# Patient Record
Sex: Male | Born: 1955 | Race: White | Hispanic: No | Marital: Married
Health system: Northeastern US, Academic
[De-identification: ages and names within clinical notes are randomized; demographics above are authoritative.]

---

## 2020-02-20 ENCOUNTER — Inpatient Hospital Stay: Admit: 2020-02-20 | Discharge: 2020-02-20 | Payer: PRIVATE HEALTH INSURANCE

## 2020-02-20 ENCOUNTER — Encounter: Admit: 2020-02-20 | Payer: PRIVATE HEALTH INSURANCE

## 2020-02-20 DIAGNOSIS — R37 Sexual dysfunction, unspecified: Secondary | ICD-10-CM

## 2020-02-20 DIAGNOSIS — K219 Gastro-esophageal reflux disease without esophagitis: Secondary | ICD-10-CM

## 2020-02-20 DIAGNOSIS — K7581 Nonalcoholic steatohepatitis (NASH): Secondary | ICD-10-CM

## 2020-02-20 DIAGNOSIS — J45909 Unspecified asthma, uncomplicated: Secondary | ICD-10-CM

## 2020-02-20 DIAGNOSIS — Z0000 Encounter for general adult medical examination without abnormal findings: Secondary | ICD-10-CM

## 2020-02-20 DIAGNOSIS — R058 Cough with exposure to severe acute respiratory syndrome coronavirus 2 (SARS-CoV-2): Secondary | ICD-10-CM

## 2020-02-20 DIAGNOSIS — R7303 Prediabetes: Secondary | ICD-10-CM

## 2020-02-20 LAB — CBC WITH AUTO DIFFERENTIAL
BKR BLOOD, UA: 40.7 % — ABNORMAL HIGH (ref 17.0–50.0)
BKR WAM ABSOLUTE IMMATURE GRANULOCYTES.: 0.03 x 1000/??L (ref 0.00–0.30)
BKR WAM ABSOLUTE LYMPHOCYTE COUNT.: 3.45 x 1000/??L (ref 0.60–3.70)
BKR WAM ABSOLUTE NRBC (2 DEC): 0 x 1000/??L (ref 0.00–1.00)
BKR WAM ANALYZER ANC: 4.23 x 1000/??L (ref 2.00–7.60)
BKR WAM BASOPHIL ABSOLUTE COUNT.: 0.07 x 1000/??L (ref 0.00–1.00)
BKR WAM BASOPHILS: 0.8 % (ref 0.0–1.4)
BKR WAM EOSINOPHIL ABSOLUTE COUNT.: 0.11 x 1000/??L (ref 0.00–1.00)
BKR WAM HEMATOCRIT (2 DEC): 47.3 % (ref 38.50–50.00)
BKR WAM IMMATURE GRANULOCYTES: 0.4 % (ref 0.0–1.0)
BKR WAM LYMPHOCYTES: 40.7 % (ref 17.0–50.0)
BKR WAM MCH (PG): 31.2 pg (ref 27.0–33.0)
BKR WAM MCHC: 34 g/dL (ref 31.0–36.0)
BKR WAM MCV: 91.7 fL (ref 80.0–100.0)
BKR WAM MONOCYTE ABSOLUTE COUNT.: 0.59 x 1000/??L (ref 0.00–1.00)
BKR WAM MONOCYTES: 7 % (ref 4.0–12.0)
BKR WAM MPV: 12.5 fL — ABNORMAL HIGH (ref 8.0–12.0)
BKR WAM NEUTROPHILS: 49.8 % (ref 39.0–72.0)
BKR WAM NUCLEATED RED BLOOD CELLS: 0 % (ref 0.0–1.0)
BKR WAM PLATELETS: 221 x1000/??L — ABNORMAL HIGH (ref 150–420)
BKR WAM RDW-CV: 11.9 % — ABNORMAL HIGH (ref 11.0–15.0)
BKR WAM RED BLOOD CELL COUNT.: 5.16 M/??L (ref 4.00–6.00)
BKR WAM WHITE BLOOD CELL COUNT: 8.5 x1000/??L (ref 4.0–11.0)

## 2020-02-20 LAB — BASIC METABOLIC PANEL
BKR ANION GAP: 11 mg/L (ref 7–17)
BKR BLOOD UREA NITROGEN: 19 mg/dL (ref 8–23)
BKR BUN / CREAT RATIO: 25.3 — ABNORMAL HIGH (ref 8.0–23.0)
BKR CALCIUM: 9.9 mg/dL (ref 8.8–10.2)
BKR CHLORIDE: 104 mmol/L (ref 98–107)
BKR CO2: 27 mmol/L (ref 20–30)
BKR CREATININE: 0.75 mg/dL (ref 0.40–1.30)
BKR EGFR (AFR AMER): >60 mL/min/{1.73_m2} (ref >60)
BKR EGFR (NON AFRICAN AMERICAN): >60 mL/min/{1.73_m2} (ref >60)
BKR GLUCOSE: 122 mg/dL — ABNORMAL HIGH (ref 70–100)
BKR POTASSIUM: 4.8 mmol/L (ref 3.3–5.3)
BKR SODIUM: 142 mmol/L (ref 136–144)
BKR WAM HEMOGLOBIN: 142 mmol/L (ref 136–144)

## 2020-02-20 LAB — TSH
BKR ALKALINE PHOSPHATASE: 2.06 ??IU/mL — ABNORMAL HIGH (ref 9–122)
BKR THYROID STIMULATING HORMONE: 2.06 u[IU]/mL

## 2020-02-20 LAB — LIPID PANEL
BKR CHOLESTEROL/HDL RATIO: 3.5 mg/dL (ref 0.0–5.0)
BKR CHOLESTEROL: 189 mg/dL
BKR HDL CHOLESTEROL: 54 mg/dL (ref >=40)
BKR LDL CHOLESTEROL CALCULATED: 114 mg/dL — ABNORMAL HIGH
BKR TRIGLYCERIDES: 103 mg/dL

## 2020-02-20 LAB — HEPATIC FUNCTION PANEL
BKR A/G RATIO: 1.8 (ref 1.0–2.2)
BKR ALANINE AMINOTRANSFERASE (ALT): 63 U/L — ABNORMAL HIGH (ref 9–59)
BKR ALBUMIN: 4.9 g/dL (ref 3.6–4.9)
BKR ASPARTATE AMINOTRANSFERASE (AST): 50 U/L — ABNORMAL HIGH (ref 10–35)
BKR AST/ALT RATIO: 0.8
BKR BILIRUBIN DIRECT: 0.2 mg/dL (ref <=0.3)
BKR BILIRUBIN TOTAL: 1.7 mg/dL — ABNORMAL HIGH (ref <=1.2)
BKR GLOBULIN: 2.8 g/dL

## 2020-02-20 LAB — URINALYSIS-MACROSCOPIC W/REFLEX MICROSCOPIC
BKR BILIRUBIN, UA: NEGATIVE
BKR GLUCOSE, UA: NEGATIVE fL — ABNORMAL HIGH (ref 8.0–12.0)
BKR KETONES, UA: NEGATIVE % (ref 39.0–72.0)
BKR LEUKOCYTE ESTERASE, UA: NEGATIVE
BKR NITRITE, UA: NEGATIVE % (ref 0.0–1.4)
BKR PH, UA: 7 (ref 5.5–7.5)
BKR SPECIFIC GRAVITY, UA: 1.029 (ref 1.005–1.030)
BKR UROBILINOGEN, UA: <2 EU/dL (ref <=2.0)
BKR WAM EOSINOPHILS: NEGATIVE % (ref 0.0–5.0)

## 2020-02-20 LAB — ALBUMIN/CREATININE PANEL, URINE, RANDOM
BKR ALBUMIN, URINE, RANDOM: 62.5 mg/L
BKR ALBUMIN/CREATININE RATIO, URINE, RANDOM: 35.7 mg/g{creat} — ABNORMAL HIGH (ref <30.0)
BKR CREATININE, URINE, RANDOM: 175 mg/dL

## 2020-02-20 LAB — HEMOGLOBIN A1C
BKR ESTIMATED AVERAGE GLUCOSE: 126 mg/dL (ref 1.0–2.2)
BKR HEMOGLOBIN A1C: 6 % — ABNORMAL HIGH (ref 4.0–5.6)

## 2020-02-20 LAB — URIC ACID: BKR URIC ACID: 4.7 mg/dL (ref 2.7–7.3)

## 2020-02-20 LAB — PSA, TOTAL (SCREENING) (BH GH L LMW YH)
BKR PROSTATE SPECIFIC ANTIGEN, SCREENING: 0.683 ng/mL (ref <=4.000)
BKR PROTEIN TOTAL: 0.683 ng/mL (ref <=4.000)

## 2020-03-04 ENCOUNTER — Inpatient Hospital Stay: Admit: 2020-03-04 | Discharge: 2020-03-04 | Payer: PRIVATE HEALTH INSURANCE

## 2020-03-04 DIAGNOSIS — Z20828 Contact with and (suspected) exposure to other viral communicable diseases: Secondary | ICD-10-CM

## 2020-03-04 DIAGNOSIS — Z20822 Contact with and (suspected) exposure to covid-19: Secondary | ICD-10-CM

## 2020-03-04 LAB — COVID-19 CLEARANCE OR FOR PLACEMENT ONLY: BKR SARS-COV-2 RNA (COVID-19) (YH): NEGATIVE

## 2020-03-05 ENCOUNTER — Inpatient Hospital Stay: Admit: 2020-03-05 | Discharge: 2020-03-05 | Payer: PRIVATE HEALTH INSURANCE

## 2020-03-05 DIAGNOSIS — Z20822 Contact with and (suspected) exposure to covid-19: Secondary | ICD-10-CM

## 2020-03-05 DIAGNOSIS — R058 Other specified cough: Secondary | ICD-10-CM

## 2020-03-07 LAB — SARS COV-2 (COVID-19) RNA- REFERENCE LAB (BH GH LMW Q YH): BKR SARS-COV-2 RNA (COVID-19) (YH): NOT DETECTED % (ref 4.0–12.0)

## 2022-02-17 ENCOUNTER — Encounter: Admit: 2022-02-17 | Payer: PRIVATE HEALTH INSURANCE

## 2022-02-17 ENCOUNTER — Inpatient Hospital Stay: Admit: 2022-02-17 | Discharge: 2022-02-17 | Payer: PRIVATE HEALTH INSURANCE

## 2022-02-17 DIAGNOSIS — Z0000 Encounter for general adult medical examination without abnormal findings: Secondary | ICD-10-CM

## 2022-02-17 DIAGNOSIS — R7303 Prediabetes: Secondary | ICD-10-CM

## 2022-02-17 DIAGNOSIS — Z125 Encounter for screening for malignant neoplasm of prostate: Secondary | ICD-10-CM

## 2022-02-17 LAB — CBC WITH AUTO DIFFERENTIAL
BKR BUN / CREAT RATIO: 44.2 % — ABNORMAL HIGH (ref 17.0–50.0)
BKR NITRITE, UA: 16.2 g/dL (ref 13.2–17.1)
BKR WAM ABSOLUTE IMMATURE GRANULOCYTES.: 0.02 x 1000/??L (ref 0.00–0.30)
BKR WAM ABSOLUTE LYMPHOCYTE COUNT.: 3.69 x 1000/??L (ref 0.60–3.70)
BKR WAM ABSOLUTE NRBC (2 DEC): 0 x 1000/??L (ref 0.00–1.00)
BKR WAM ANALYZER ANC: 3.82 x 1000/??L (ref 2.00–7.60)
BKR WAM BASOPHIL ABSOLUTE COUNT.: 0.06 x 1000/??L (ref 0.00–1.00)
BKR WAM BASOPHILS: 0.7 % (ref 0.0–1.4)
BKR WAM EOSINOPHIL ABSOLUTE COUNT.: 0.17 x 1000/??L (ref 0.00–1.00)
BKR WAM EOSINOPHILS: 2 % (ref 0.0–5.0)
BKR WAM HEMATOCRIT (2 DEC): 49 % (ref 38.50–50.00)
BKR WAM HEMOGLOBIN: 16.2 g/dL (ref 13.2–17.1)
BKR WAM IMMATURE GRANULOCYTES: 0.2 % (ref 0.0–1.0)
BKR WAM LYMPHOCYTES: 44.2 % (ref 17.0–50.0)
BKR WAM MCH (PG): 30.2 pg (ref 27.0–33.0)
BKR WAM MCHC: 33.1 g/dL (ref 31.0–36.0)
BKR WAM MCV: 91.4 fL (ref 80.0–100.0)
BKR WAM MONOCYTE ABSOLUTE COUNT.: 0.59 x 1000/??L (ref 0.00–1.00)
BKR WAM MONOCYTES: 7.1 % (ref 4.0–12.0)
BKR WAM MPV: 12.2 fL — ABNORMAL HIGH (ref 8.0–12.0)
BKR WAM NEUTROPHILS: 45.8 % (ref 39.0–72.0)
BKR WAM NUCLEATED RED BLOOD CELLS: 0 % (ref 0.0–1.0)
BKR WAM PLATELETS: 212 x1000/??L (ref 150–420)
BKR WAM RDW-CV: 13.2 % — ABNORMAL HIGH (ref 11.0–15.0)
BKR WAM RED BLOOD CELL COUNT.: 5.36 M/??L (ref 4.00–6.00)
BKR WAM WHITE BLOOD CELL COUNT: 8.4 x1000/??L (ref 4.0–11.0)

## 2022-02-17 LAB — HEPATIC FUNCTION PANEL
BKR A/G RATIO: 1.4 (ref 1.0–2.2)
BKR ALANINE AMINOTRANSFERASE (ALT): 13 U/L (ref 9–59)
BKR ALBUMIN: 4.4 g/dL (ref 3.6–4.9)
BKR ALKALINE PHOSPHATASE: 67 U/L — ABNORMAL HIGH (ref 9–122)
BKR ASPARTATE AMINOTRANSFERASE (AST): 21 U/L (ref 10–35)
BKR AST/ALT RATIO: 1.6
BKR BILIRUBIN DIRECT: 0.2 mg/dL (ref 0.0–<=0.3)
BKR BILIRUBIN TOTAL: 1.4 mg/dL — ABNORMAL HIGH (ref <=1.2)
BKR CLARITY, UA: 7.5 g/dL (ref 6.6–8.7)
BKR GLOBULIN: 3.1 g/dL (ref 2.3–3.5)
BKR PROTEIN TOTAL: 7.5 g/dL (ref 6.6–8.7)

## 2022-02-17 LAB — ALBUMIN/CREATININE PANEL, URINE, RANDOM
BKR ALBUMIN, URINE, RANDOM: 36.7 mg/L
BKR ALBUMIN/CREATININE RATIO, URINE, RANDOM: 22.1 mg/g{creat} (ref <30.0)
BKR CREATININE, URINE, RANDOM: 166 mg/dL

## 2022-02-17 LAB — PSA, TOTAL (SCREENING) (BH GH L LMW YH): BKR PROSTATE SPECIFIC ANTIGEN, SCREENING: 0.635 ng/mL (ref 2.3–<=4.000)

## 2022-02-17 LAB — URINALYSIS-MACROSCOPIC W/REFLEX MICROSCOPIC
BKR BILIRUBIN, UA: NEGATIVE mg/g Cr (ref 4.0–11.0)
BKR BLOOD, UA: NEGATIVE
BKR GLUCOSE, UA: NEGATIVE
BKR KETONES, UA: NEGATIVE mg/L
BKR LEUKOCYTE ESTERASE, UA: NEGATIVE
BKR PH, UA: 5.5 (ref 5.5–7.5)
BKR SPECIFIC GRAVITY, UA: 1.027 (ref 1.005–1.030)
BKR UROBILINOGEN, UA (MG/DL): <2 mg/dL (ref 38.50–<=2.0)

## 2022-02-17 LAB — BASIC METABOLIC PANEL
BKR ANION GAP: 11 g/dL (ref 7–17)
BKR BLOOD UREA NITROGEN: 17 mg/dL (ref 8–23)
BKR CALCIUM: 9.2 mg/dL (ref 8.8–10.2)
BKR CHLORIDE: 106 mmol/L (ref 98–107)
BKR CO2: 25 mmol/L (ref 20–30)
BKR CREATININE: 0.64 mg/dL — ABNORMAL HIGH (ref 0.40–1.30)
BKR EGFR, CREATININE (CKD-EPI 2021): >60 mL/min/1.73m2 (ref >=60–12.0)
BKR GLUCOSE: 116 mg/dL — ABNORMAL HIGH (ref 70–100)
BKR POTASSIUM: 4.1 mmol/L (ref 3.3–5.3)
BKR SODIUM: 142 mmol/L (ref 136–144)

## 2022-02-17 LAB — LIPID PANEL
BKR CHOLESTEROL/HDL RATIO: 3.2 (ref 0.0–5.0)
BKR CHOLESTEROL: 190 mg/dL
BKR HDL CHOLESTEROL: 59 mg/dL (ref >=40)
BKR LDL CHOLESTEROL SAMPSON CALCULATED: 118 mg/dL — ABNORMAL HIGH
BKR TRIGLYCERIDES: 70 mg/dL

## 2022-02-17 LAB — HEMOGLOBIN A1C
BKR ESTIMATED AVERAGE GLUCOSE: 123 mg/dL
BKR HEMOGLOBIN A1C: 5.9 % — ABNORMAL HIGH (ref 4.0–5.6)

## 2022-02-17 LAB — TSH: BKR THYROID STIMULATING HORMONE: 2.45 u[IU]/mL

## 2022-02-17 LAB — URIC ACID: BKR URIC ACID: 4.3 mg/dL (ref 2.7–7.3)

## 2022-03-25 IMAGING — MR [PERSON_NAME]. SEQUENCIAS^[PERSON_NAME]
5 series · 16 of 16 positions shown · non-contrast
Comparison: none

[Series 7: t1_tse_sag_(person_name)_1 · sagittal · 5.0mm · 0.73mm/px · 2 of 12 slices shown]
[im 1/12]
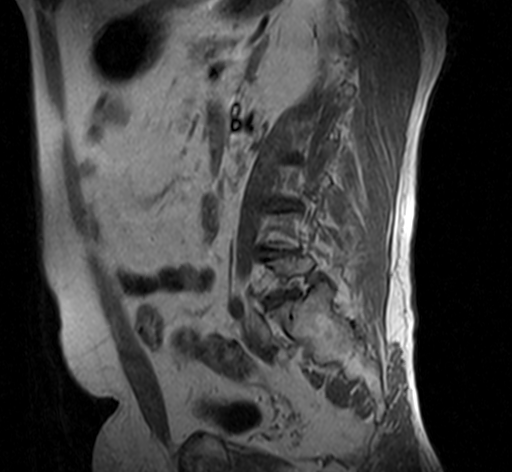
[im 12/12]
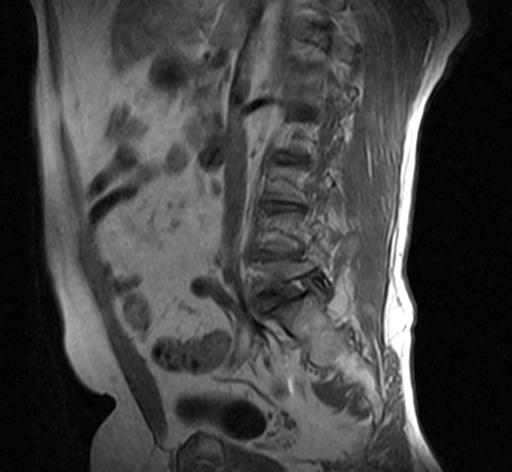

[Series 8: t2_tse_rst_sag_(person_name)_1 · sagittal · 5.0mm · 0.78mm/px · 2 of 12 slices shown]
[im 1/12]
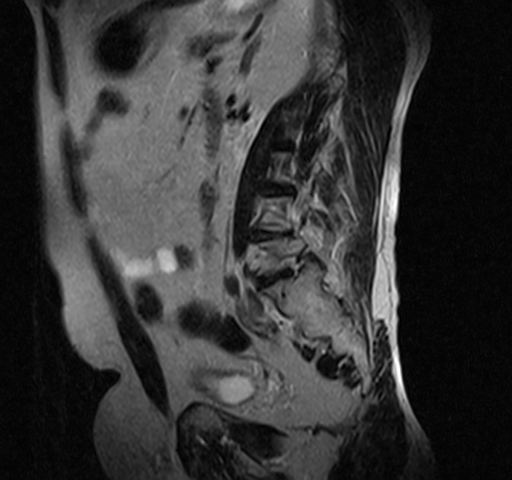
[im 12/12]
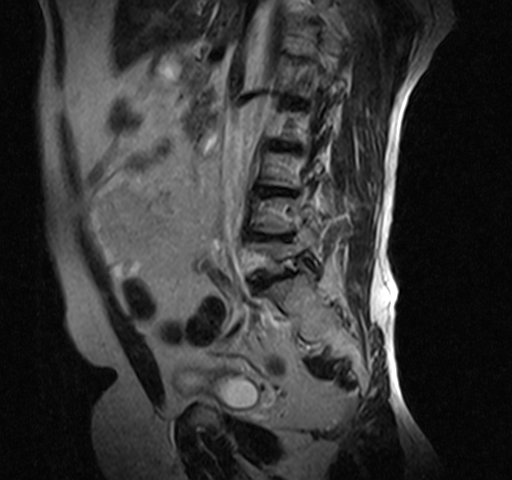

[Series 9: t2_tirm_fs_sag_(person_name)_1 · sagittal · 5.0mm · 1.25mm/px · 2 of 12 slices shown]
[im 1/12]
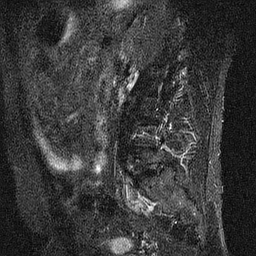
[im 12/12]
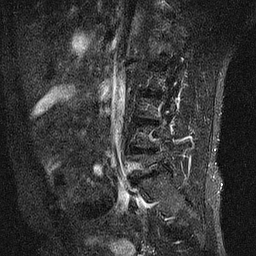

[Series 10: t2_(id)_tra_(person_name)_1 · axial · 5.0mm · 0.58mm/px · z∈[-53,+161]mm · 7 of 44 slices shown]
[im 1/44]
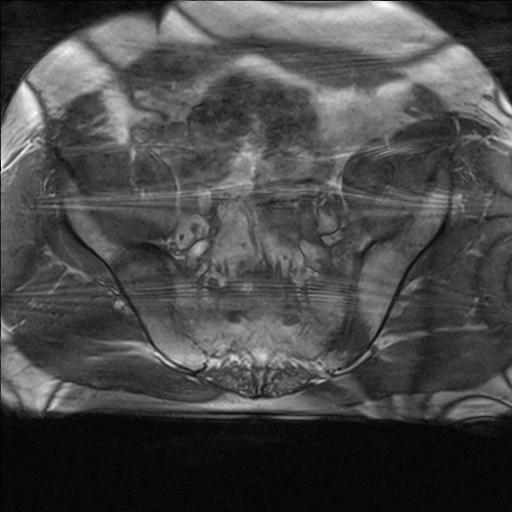
[im 8/44]
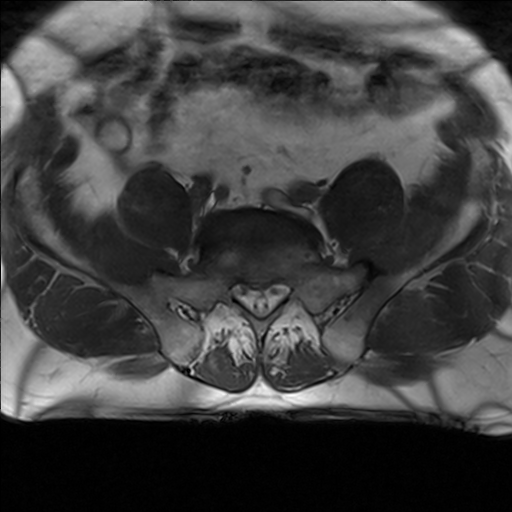
[im 15/44]
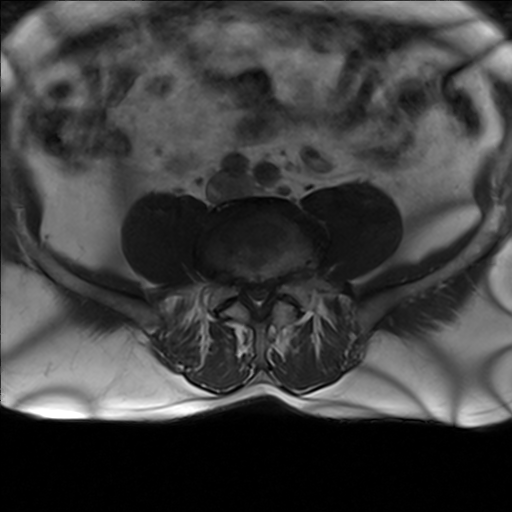
[im 22/44]
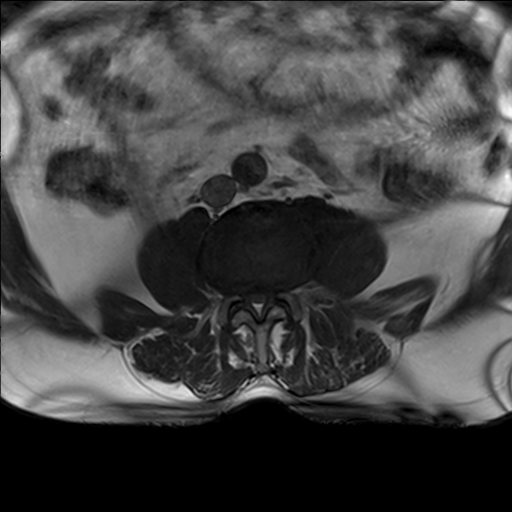
[im 29/44]
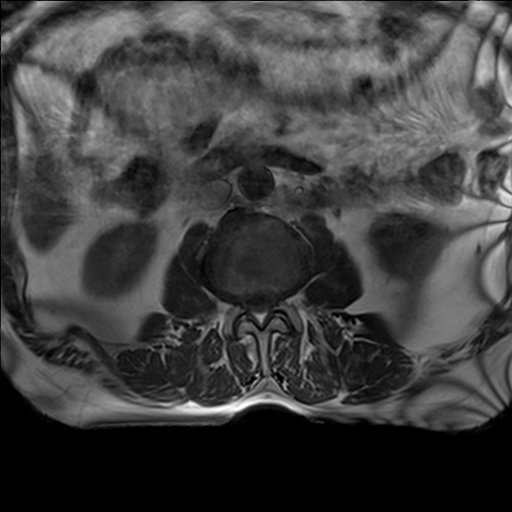
[im 36/44]
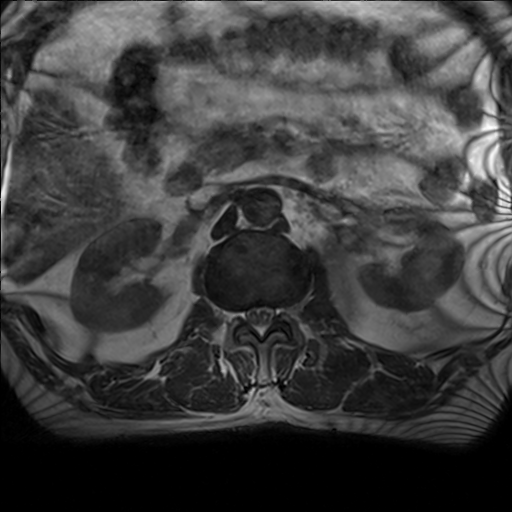
[im 44/44]
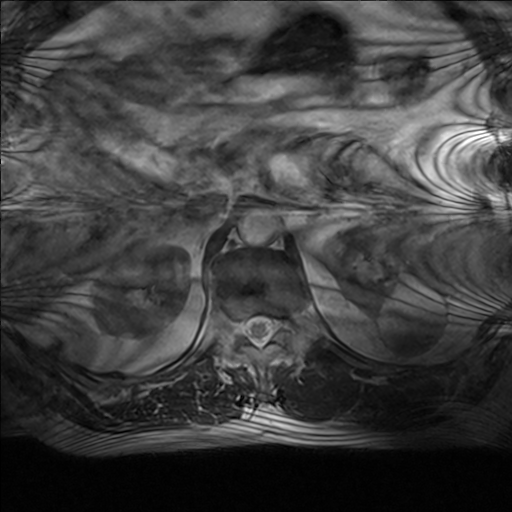

[Series 11: t1_tse_tra ( fast · axial · 5.0mm · 0.78mm/px · z∈[-51,+83]mm · 3 of 16 slices shown]
[im 1/16]
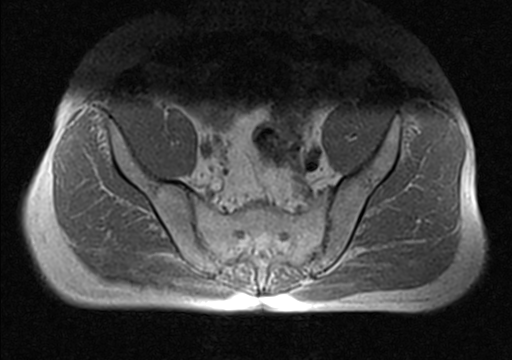
[im 8/16]
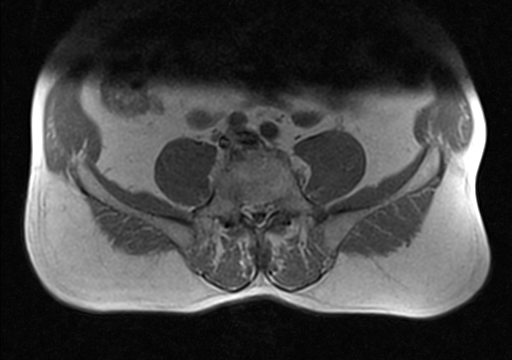
[im 16/16]
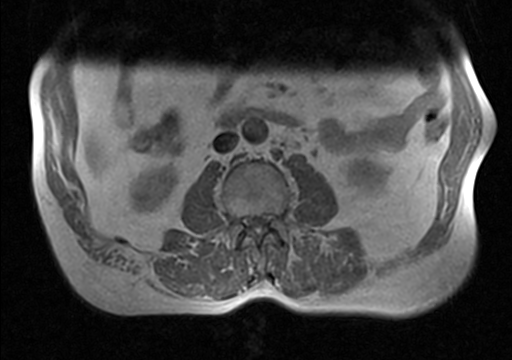

[16 of 16 positions shown; findings below may reference images not displayed]

Gênero:Masculino

Método:
Realizado estudo de ressonância magnética de coluna lombar com técnica FSE, com sequências
ponderadas em T1 e em T2, sem a infusão endovenosa do meio de contraste paramagnético.
Análise:
Corpos vertebrais alinhados e com altura preservada.
RESSONÂNCIA MAGNÉTICA DE COLUNA LOMBAR
Pedículos vertebrais constitucionalmente curto. 
Osteófitos marginais lombares. 
Hérnias de Schmorl através de alguns platôs vertebrais. 
Alterações degenerativas gordurosas de L2-L3 a L5-S1. 
alterações degenerativas das articulações interapofisárias, notadamente em L5-S1. 
Ligamentos amarelos discretamente espessados. 
Abaulamento discal em L2-L3, tocando a face ventral do saco dural, sem sinais de compressão radicular. 
Abaulamentos discais em L3-L4 e L4-L5, tocando a face ventral do saco dural, notando-se contato com as
raízes emergentes de L3 e de L4. 
Abaulamento discal em L5-S1, obliterando os recessos foraminais e comprimindo as raízes emergentes de
Gênero:Masculino
Estenose do canal vertebral em L2-L3 a L4-L5.
Cone medular com morfologia e intensidade de sinal preservados.
Raízes da cauda equina com distribuição habitual.
Demais forames intervertebrais com amplitude preservada.
Planos musculoadiposos paravertebrais de aspecto habitual.
Impressão Diagnóstica:
Abaulamento discal em L2-L3, sem sinais de compressão radicular. 
Abaulamentos discais em L3-L4 e L4-L5, notando-se contato com as raízes emergentes de L3 e de L4. 
Abaulamento discal em L5-S1, obliterando os recessos foraminais e comprimindo as raízes emergentes de
Estenose do canal vertebral em L2-L3 a L4-L5.

## 2022-04-17 ENCOUNTER — Encounter: Admit: 2022-04-17 | Payer: PRIVATE HEALTH INSURANCE | Attending: Gastroenterology

## 2022-04-17 ENCOUNTER — Ambulatory Visit: Admit: 2022-04-17 | Payer: PRIVATE HEALTH INSURANCE | Attending: Anesthesiology

## 2022-04-17 ENCOUNTER — Inpatient Hospital Stay: Admit: 2022-04-17 | Discharge: 2022-04-17 | Payer: PRIVATE HEALTH INSURANCE | Attending: Gastroenterology

## 2022-04-17 DIAGNOSIS — J45909 Unspecified asthma, uncomplicated: Secondary | ICD-10-CM

## 2022-04-17 DIAGNOSIS — K2100 Gastro-esophageal reflux disease with esophagitis, without bleeding: Secondary | ICD-10-CM

## 2022-04-17 DIAGNOSIS — Z1211 Encounter for screening for malignant neoplasm of colon: Secondary | ICD-10-CM

## 2022-04-17 DIAGNOSIS — E119 Type 2 diabetes mellitus without complications: Secondary | ICD-10-CM

## 2022-04-17 DIAGNOSIS — R1013 Epigastric pain: Secondary | ICD-10-CM

## 2022-04-17 DIAGNOSIS — E785 Hyperlipidemia, unspecified: Secondary | ICD-10-CM

## 2022-04-17 DIAGNOSIS — K640 First degree hemorrhoids: Secondary | ICD-10-CM

## 2022-04-17 DIAGNOSIS — K449 Diaphragmatic hernia without obstruction or gangrene: Secondary | ICD-10-CM

## 2022-04-17 DIAGNOSIS — R7989 Other specified abnormal findings of blood chemistry: Secondary | ICD-10-CM

## 2022-04-17 DIAGNOSIS — K222 Esophageal obstruction: Secondary | ICD-10-CM

## 2022-04-17 DIAGNOSIS — K2270 Barrett's esophagus without dysplasia: Secondary | ICD-10-CM

## 2022-04-17 DIAGNOSIS — Z7984 Long term (current) use of oral hypoglycemic drugs: Secondary | ICD-10-CM

## 2022-04-17 DIAGNOSIS — Z79899 Other long term (current) drug therapy: Secondary | ICD-10-CM

## 2022-04-17 DIAGNOSIS — K635 Polyp of colon: Secondary | ICD-10-CM

## 2022-04-17 DIAGNOSIS — Z87891 Personal history of nicotine dependence: Secondary | ICD-10-CM

## 2022-04-17 MED ORDER — FENTANYL (PF) 50 MCG/ML INJECTION SOLUTION
50 mcg/mL | Status: CP
Start: 2022-04-17 — End: ?

## 2022-04-17 MED ORDER — SODIUM CHLORIDE 0.9 % (FLUSH) INJECTION SYRINGE
0.9 % | INTRAVENOUS | Status: DC | PRN
Start: 2022-04-17 — End: 2022-04-17

## 2022-04-17 MED ORDER — LACTATED RINGERS INTRAVENOUS SOLUTION
INTRAVENOUS | Status: DC
Start: 2022-04-17 — End: 2022-04-17
  Administered 2022-04-17: 16:00:00 1000.000 mL/h via INTRAVENOUS

## 2022-04-17 MED ORDER — GLYCOPYRROLATE 0.2 MG/ML INJECTION SOLUTION
0.2 mg/mL | Status: CP
Start: 2022-04-17 — End: ?

## 2022-04-17 MED ORDER — LACTATED RINGERS INTRAVENOUS SOLUTION
INTRAVENOUS | Status: DC | PRN
Start: 2022-04-17 — End: 2022-04-17
  Administered 2022-04-17: 16:00:00 via INTRAVENOUS

## 2022-04-17 MED ORDER — SODIUM CHLORIDE 0.9 % (FLUSH) INJECTION SYRINGE
0.9 % | Freq: Three times a day (TID) | INTRAVENOUS | Status: DC
Start: 2022-04-17 — End: 2022-04-17

## 2022-04-17 MED ORDER — EMPAGLIFLOZIN 10 MG TABLET
10 mg | Freq: Every day | ORAL | Status: AC
Start: 2022-04-17 — End: ?

## 2022-04-17 MED ORDER — PROPOFOL 10 MG/ML INTRAVENOUS EMULSION
10 mg/mL | INTRAVENOUS | Status: DC | PRN
Start: 2022-04-17 — End: 2022-04-17
  Administered 2022-04-17 (×7): 10 mg/mL via INTRAVENOUS

## 2022-04-17 MED ORDER — LIDOCAINE (PF) 10 MG/ML (1 %) INJECTION SOLUTION
101 mg/mL (1 %) | INTRAVENOUS | Status: DC | PRN
Start: 2022-04-17 — End: 2022-04-17
  Administered 2022-04-17: 16:00:00 10 mg/mL (1 %) via INTRAVENOUS

## 2022-04-17 MED ORDER — FENTANYL (PF) 50 MCG/ML INJECTION SOLUTION
50 mcg/mL | INTRAVENOUS | Status: DC | PRN
Start: 2022-04-17 — End: 2022-04-17
  Administered 2022-04-17: 16:00:00 50 mcg/mL via INTRAVENOUS

## 2022-04-17 MED ORDER — GLYCOPYRROLATE 0.2 MG/ML INJECTION SOLUTION
0.2 mg/mL | INTRAVENOUS | Status: DC | PRN
Start: 2022-04-17 — End: 2022-04-17
  Administered 2022-04-17: 16:00:00 0.2 mg/mL via INTRAVENOUS

## 2022-04-17 NOTE — Anesthesia Post-Procedure Evaluation
Anesthesia Post-op NotePatient: Tracy Miller SantosProcedure(s):  Procedure(s) (LRB):UPPER GI ENDOSCOPY; W/BX, SINGLE/MULTIPLE with gastric  & esophagus bxs (N/A)COLONOSCOPY, FLEXIBLE; W/REMOVAL, LESION, SNARE with cecum polyp cs Last Vitals:  I have reviewed the post-operative vital signs as noted in the Epic chart.POSTOP EVALUATION:      Patient Recovery Location:  PACU     Vital Signs Status:  Stable     Patient Participation:  Patient participated     Mental Status:  Awake     Respiratory Status:  Acceptable     Airway Patency:  Patent     Cardiovascular/Hydration Status:  Stable     Pain Management:  Satisfactory to patient     Nausea/Vomiting Status:  Satisfactory to patientNo notable events documented.

## 2022-04-17 NOTE — Other
Post Anesthesia Transfer of Care NotePatient: Tracy Miller SantosProcedure(s) Performed: Procedure(s) (LRB):UPPER GI ENDOSCOPY; W/BX, SINGLE/MULTIPLE with gastric  & esophagus bxs (N/A)COLONOSCOPY, FLEXIBLE; W/REMOVAL, LESION, SNARE with cecum polyp csLast Vitals: I have reviewed the post-operative vital signs during the handoff as noted in the Epic chart.POSTOP HANDOFF :      Patient Location:  PACU     Level of Consciousness:  Sedated     VS stable since last recorded intra-op set? Yes       Oxygen source: room air112/74Temp 98F62bpm [id number] sat 97%Patient co-morbidities, intra-operative course, intake & output and antibiotics as per Anesthesia record were discussed with the RN.

## 2022-04-17 NOTE — Other
Patient tolerated procedure well.  Dr Ho spoke with patient and reviewed results. Discharge instructions reviewed and patient verbalized understanding. Printed instructions given to patient. All questions answered.

## 2022-04-17 NOTE — Other
Pre-operative History & PhysicalHistory: Tracy Hancock is a 67 y.o. male patient seen in office for screening colonoscopy and abdominal painRefer to consult note / office note.Medical History: PMH PSH Past Medical History: Diagnosis Date  Asthma   Diabetes mellitus (HC Code)   Past Surgical History: Procedure Laterality Date  HERNIA REPAIR    Social History Family History Social History Tobacco Use  Smoking status: Former   Types: Cigarettes  Smokeless tobacco: Not on file Substance Use Topics  Alcohol use: Yes   Comment: socially  History reviewed. No pertinent family history. Allergies No Known Allergies Prior to Admission Medications No current facility-administered medications on file prior to encounter. Current Outpatient Medications on File Prior to Encounter Medication Sig  empagliflozin (JARDIANCE) 10 mg tablet Take 1 tablet (10 mg total) by mouth daily.   Review of Systems: Cardiovascular: no chest pain,Pulmonary: no shortness of breath, no cough.GI: as outlined in the history of present illness.Neuro: no new neurological deficitsPhysical Exam: Gen: in no acute distressBP 124/78  - Pulse (!) 59  - Temp 97.2 F (36.2 C) (Temporal)  - Resp 16  - Ht 5' 6 (1.676 m)  - Wt 86.2 kg  - SpO2 98%  - BMI 30.67 kg/m Cardiovascular: regular rate and rhythmPulmonary: clear to auscultation bilaterallyAbdomen: soft, non-tender, non-distended, with normoactive bowel sounds.Assessment/Plan: Plan for Colonoscopy and Upper EndoscopyI discussed the risks of the procedure which include but are not limited to bleeding, infection, damage to organ or surrounding structure including perforation, missed lesions and complications as related to anesthesia. Electronically signed by Rocky Link, MD2/15/202412:07 [facility] [phone]

## 2022-04-17 NOTE — Other
Operative Diagnosis:Pre-op:   * No pre-op diagnosis entered * Patient Coded Diagnosis   Pre-op diagnosis: Epigastric pain, Colon cancer screening  Post-op diagnosis: Esophagitis, Hiatal hernia, Polyp of colon, unspecified part of colon, unspecified type, Internal hemorrhoids  Patient Diagnosis   None    Post-op diagnosis:   * Esophagitis [K20.90]   * Hiatal hernia [K44.9]   * Polyp of colon, unspecified part of colon, unspecified type [K63.5]   * Internal hemorrhoids [K64.8]Operative Procedure(s) :Procedure(s) (LRB):UPPER GI ENDOSCOPY; W/BX, SINGLE/MULTIPLE with gastric  & esophagus bxs (N/A)COLONOSCOPY, FLEXIBLE; W/REMOVAL, LESION, SNARE with cecum polyp csPost-op Procedure & Diagnosis ConfirmationPost-op Diagnosis: Post-op Diagnosis confirmed (no changes)Post-op Procedure: Post-op Procedure confirmed (no changes)Anesthesia ClarifiersGI/Endoscopy: Planned Screening Colonoscopy - Procedure Performed (see above)

## 2022-04-17 NOTE — Anesthesia Pre-Procedure Evaluation
This is a 67 y.o. male scheduled for UPPER GI ENDOSCOPYCOLONOSCOPY.Review of Systems/ Medical HistoryPatient summary, nursing notes, EKG/Cardiac Studies , Labs, pre-procedure vitals, height, weight and NPO status reviewed.No previous anesthesia concernsAnesthesia Evaluation: There is no height or weight on file to calculate BMI. CC/HPI: Past Surgical History:  No past surgical history on file.Cardiovascular: Negative   -Exercise tolerance: >4 METS -Vascular Disease:  Negative   Respiratory: -Airway disorders:      -Asthma: yesHEENT: Negative.Neuromuscular: NegativeSkeletal/Skin:  NegativeGastrointestinal/Genitourinary: -Gastrointestinal Disorders:  Patient has GERD.Hematological/Lymphatic: NegativeEndocrine/Metabolic: -Diabetes mellitus:  Patient has diabetes mellitus.Behavioral/Social/Psychiatric & Syndromes: NegativePhysical ExamCardiovascular:    Rhythm: regularHeart Sounds: S1 present and S2 present.Pulmonary:  Patient's breath sounds clear to auscultationAirway:  Mallampati: IITM distance: >3 FBNeck ROM: fullMouth Opening: >3cmDental:  Dentition: dentures lower and dentures upperAnesthesia PlanASA 2 The primary anesthesia plan is  general. Perioperative Code Status confirmed: It is my understanding that the patient is currently designated as 'Full Code' and will remain so throughout the perioperative period.Anesthesia informed consent obtained. Consent obtained from: patientPlan discussed with CRNA.Anesthesiologist's Pre Op NoteI personally evaluated and examined the patient prior to the intra-operative phase of care on the day of the procedure.Marland Kitchen

## 2022-06-23 ENCOUNTER — Inpatient Hospital Stay: Admit: 2022-06-23 | Discharge: 2022-06-23 | Payer: PRIVATE HEALTH INSURANCE | Attending: Emergency Medicine

## 2022-06-23 ENCOUNTER — Encounter: Admit: 2022-06-23 | Payer: PRIVATE HEALTH INSURANCE

## 2022-06-23 ENCOUNTER — Emergency Department: Admit: 2022-06-23 | Payer: PRIVATE HEALTH INSURANCE

## 2022-06-23 DIAGNOSIS — E119 Type 2 diabetes mellitus without complications: Secondary | ICD-10-CM

## 2022-06-23 DIAGNOSIS — R109 Unspecified abdominal pain: Secondary | ICD-10-CM

## 2022-06-23 DIAGNOSIS — K8050 Calculus of bile duct without cholangitis or cholecystitis without obstruction: Secondary | ICD-10-CM

## 2022-06-23 DIAGNOSIS — R112 Nausea with vomiting, unspecified: Secondary | ICD-10-CM

## 2022-06-23 DIAGNOSIS — J45909 Unspecified asthma, uncomplicated: Secondary | ICD-10-CM

## 2022-06-23 LAB — URINALYSIS WITH CULTURE REFLEX      (BH LMW YH)
BKR BILIRUBIN, UA: NEGATIVE U/L (ref 9–59)
BKR LEUKOCYTE ESTERASE, UA: NEGATIVE
BKR NITRITE, UA: NEGATIVE g/dL (ref 2.0–3.9)
BKR SPECIFIC GRAVITY, UA: 1.025 (ref 1.005–1.030)
BKR UROBILINOGEN, UA (MG/DL): 4 mg/dL — ABNORMAL HIGH (ref 0.0–<=2.0)

## 2022-06-23 LAB — CBC WITH AUTO DIFFERENTIAL
BKR WAM ABSOLUTE IMMATURE GRANULOCYTES.: 0.05 x 1000/ÂµL (ref 0.00–0.30)
BKR WAM ABSOLUTE LYMPHOCYTE COUNT.: 2.22 x 1000/ÂµL (ref 0.60–3.70)
BKR WAM ABSOLUTE NRBC (2 DEC): 0 x 1000/ÂµL (ref 0.00–1.00)
BKR WAM ANALYZER ANC: 8.31 x 1000/ÂµL — ABNORMAL HIGH (ref 2.00–7.60)
BKR WAM BASOPHIL ABSOLUTE COUNT.: 0.05 x 1000/ÂµL (ref 0.00–1.00)
BKR WAM BASOPHILS: 0.4 % (ref 0.0–1.4)
BKR WAM EOSINOPHIL ABSOLUTE COUNT.: 0.26 x 1000/ÂµL (ref 0.00–1.00)
BKR WAM EOSINOPHILS: 2.1 % (ref 0.0–5.0)
BKR WAM HEMATOCRIT (2 DEC): 45.6 % (ref 38.50–50.00)
BKR WAM HEMOGLOBIN: 16 g/dL (ref 13.2–17.1)
BKR WAM IMMATURE GRANULOCYTES: 0.4 % (ref 0.0–1.0)
BKR WAM LYMPHOCYTES: 18.3 % (ref 17.0–50.0)
BKR WAM MCH (PG): 31.3 pg (ref 27.0–33.0)
BKR WAM MCHC: 35.1 g/dL (ref 31.0–36.0)
BKR WAM MCV: 89.2 fL (ref 80.0–100.0)
BKR WAM MONOCYTE ABSOLUTE COUNT.: 1.21 x 1000/ÂµL — ABNORMAL HIGH (ref 0.00–1.00)
BKR WAM MONOCYTES: 10 % (ref 4.0–12.0)
BKR WAM MPV: 11.6 fL (ref 8.0–12.0)
BKR WAM NEUTROPHILS: 68.8 % (ref 39.0–72.0)
BKR WAM NUCLEATED RED BLOOD CELLS: 0 % (ref 0.0–1.0)
BKR WAM PLATELETS: 256 x1000/ÂµL (ref 150–420)
BKR WAM RDW-CV: 12.3 % (ref 11.0–15.0)
BKR WAM RED BLOOD CELL COUNT.: 5.11 M/ÂµL (ref 4.00–6.00)
BKR WAM WHITE BLOOD CELL COUNT: 12.1 x1000/ÂµL — ABNORMAL HIGH (ref 4.0–11.0)

## 2022-06-23 LAB — LIPASE: BKR LIPASE: 49 U/L (ref 11–55)

## 2022-06-23 LAB — COMPREHENSIVE METABOLIC PANEL
BKR A/G RATIO: 0.9 — ABNORMAL LOW (ref 1.0–2.2)
BKR ALANINE AMINOTRANSFERASE (ALT): 14 U/L (ref 9–59)
BKR ALBUMIN: 3.6 g/dL (ref 3.6–5.1)
BKR ALKALINE PHOSPHATASE: 62 U/L (ref 9–122)
BKR ANION GAP: 13 (ref 7–17)
BKR ASPARTATE AMINOTRANSFERASE (AST): 14 U/L (ref 10–35)
BKR AST/ALT RATIO: 1
BKR BILIRUBIN TOTAL: 1.4 mg/dL — ABNORMAL HIGH (ref <=1.2)
BKR BLOOD UREA NITROGEN: 17 mg/dL (ref 8–23)
BKR BUN / CREAT RATIO: 28.3 — ABNORMAL HIGH (ref 8.0–23.0)
BKR CALCIUM: 8.9 mg/dL (ref 8.8–10.2)
BKR CHLORIDE: 96 mmol/L — ABNORMAL LOW (ref 98–107)
BKR CO2: 25 mmol/L (ref 20–30)
BKR CREATININE: 0.6 mg/dL (ref 0.40–1.30)
BKR EGFR, CREATININE (CKD-EPI 2021): >60 mL/min/{1.73_m2} (ref >=60)
BKR GLOBULIN: 3.9 g/dL (ref 2.0–3.9)
BKR GLUCOSE: 98 mg/dL (ref 70–100)
BKR POTASSIUM: 3.4 mmol/L (ref 3.3–5.3)
BKR PROTEIN TOTAL: 7.5 g/dL (ref 5.9–8.3)
BKR SODIUM: 134 mmol/L — ABNORMAL LOW (ref 136–144)

## 2022-06-23 LAB — BILIRUBIN, DIRECT: BKR BILIRUBIN DIRECT: 0.3 mg/dL (ref <=0.3)

## 2022-06-23 LAB — URINE MICROSCOPIC     (BH GH LMW YH)
BKR PH, UA: 4 /HPF — ABNORMAL HIGH (ref 0–5)
BKR RBC/HPF INSTRUMENT: 2 /HPF (ref 0–2)
BKR WBC/HPF INSTRUMENT: 4 /HPF (ref 0–5)

## 2022-06-23 LAB — UA REFLEX CULTURE

## 2022-06-23 MED ORDER — SODIUM CHLORIDE 0.9 % BOLUS (NEW BAG)
0.9 % | Freq: Once | INTRAVENOUS | Status: DC
Start: 2022-06-23 — End: 2022-06-24

## 2022-06-23 NOTE — ED Provider Notes
Chief Complaint Patient presents with  Abdominal Pain   Co of abd pain with n/v  x 6 days   [place] same sx and admitted x 2 nights and told to have gallstones and pancreatis  in duct  small in size on abx   denies fevers HPI/PE:66 yo male, c/o episodic postprandial ruq pain.Had exacerbation of pain while in [place].(5 days pta) And was hospitalized for pancreatitis(lipase not > 3 x nl at 90) and hyperbilirubinemia (3 with inc indirect fraction).there was some concern for ? Choledocholithiasis but pt chose to sign out AMA and returned to [place] nad was told to present himself for evaluation.Pt's pain has been controlled with pain meds, but pt has had decreased po intake. No fever.Abd pain mdm; acute chole/pancreatitis/gastritis/choledocholithiasisDoubt appy/sbo/anginal equivalent  Physical ExamED Triage VitalsBP: 137/77 [06/23/22 1118]Pulse: (!) 59 [06/23/22 1118]Pulse from  O2 sat: (!) 54 [06/23/22 1333]Resp: 20 [06/23/22 1118]Temp: 97.3 ?F (36.3 ?C) [06/23/22 1118]Temp src: Temporal [06/23/22 1118]SpO2: 97 % [06/23/22 1118] BP 132/82  - Pulse 61  - Temp 97.3 ?F (36.3 ?C) (Temporal)  - Resp 20  - Wt 88 kg (194 lb 0.1 oz)  - SpO2 96%  - BMI 31.31 kg/m? Physical ExamConstitutional:     General: He is not in acute distress.   Appearance: He is not ill-appearing. HENT:    Head: Normocephalic. Eyes:    Extraocular Movements: Extraocular movements intact. Cardiovascular:    Rate and Rhythm: Normal rate. Pulmonary:    Effort: Pulmonary effort is normal. Abdominal:    General: Abdomen is protuberant.    Palpations: Abdomen is soft.    Tenderness: There is no abdominal tenderness. Negative signs include Murphy's sign and McBurney's sign. Skin:   General: Skin is warm. Neurological:    General: No focal deficit present.    Mental Status: He is alert.  ProceduresAttestation/Critical CareUS Right Upper Quadrant Final Result    Multiple layering gallstones within the gallbladder with a thickened gallbladder wall. No pericholecystic fluid. HIDA scan can be considered if there is clinical concern for acute cholecystitis.  Circumscribed hyperechoic approximately 1.2 cm structure within the right hepatic lobe. Although differential diagnosis may include a hemangioma, contrast enhanced [place] or MRI hepatic mass protocol is recommended for further evaluation.  [facility]: Unexpected findings w trackable imaging recommendation.  Reported and signed by: Joyce Copa, MD    Abdo exam prior to dc: no murphy's. No rebound.Suspect this is biliary colic.No indication for admission.Return precautions advised. Dx:abd painSuspect biliary colicUnconjugated hyperbilirubinemia ED DispositionDischarge Smitty Knudsen, MD04/22/24 1644 Marcella Dubs [place], MD04/22/24 [phone]

## 2022-06-23 NOTE — ED Notes
3:12 PM Patient exam or treatment required medical chaperone.The sensitive parts of the examination were performed with chaperone present: Yes; Chaperone Name, Role/Title: [place]

## 2022-06-23 NOTE — ED Notes
11:35 AM 67 y.o. Male presents d/t  RUQ abdominal pain / radiation across to [facility] + n/v + juandice / while on vacation in [place], diagnosed with gallstones, acute pancreatitis, abnormal liver function tests, left AMA to return to [place], on amoxicillin x Saturday + oxycodone x 9 am. Patient A/Ox4, does not appear to be in respiratory distress, denies fevers, nausea/vomiting.  4:30 PM plan for d/c4:40 PM Pt clear for d/c by provider. D/c instructions provided. Pt verbalized understanding on instructions. IV removed, catheter intact. VSS. Pt ambulatory with steady gait upon d/c.

## 2022-07-02 ENCOUNTER — Telehealth: Admit: 2022-07-02 | Payer: PRIVATE HEALTH INSURANCE | Attending: Surgery

## 2022-07-02 NOTE — Telephone Encounter
LVM to follow-up on canceled apt via mychart:NEW PATIENT - 5/8/2024Appointment Status: Canceled Cancel Reason: Patient: via Automated Reminder System Hartford Poli, MD Department: [facility] SURGERY GENERAL TRAUMA [place] Time: 2:45 PM Length: 20 minutes  History Made On: 06/24/2022 10:36 AM By: Sue Lush ES Confirmed: 06/25/2022  8:01 AM By: Bettey Mare In EDI Canceled: 07/02/2022  5:10 PM By: Bettey Mare In EDI  Appointment NotesBiliary colic. ED fu. sched w/daughter.

## 2022-07-09 ENCOUNTER — Encounter: Admit: 2022-07-09 | Payer: PRIVATE HEALTH INSURANCE | Attending: Surgery

## 2022-07-15 ENCOUNTER — Encounter: Admit: 2022-07-15 | Payer: PRIVATE HEALTH INSURANCE | Attending: Surgical Critical Care

## 2022-07-15 MED ORDER — CEFAZOLIN 1 GRAM SOLUTION FOR INJECTION
1 | Freq: Once | INTRAVENOUS | Status: DC
Start: 2022-07-15 — End: 2022-07-16

## 2022-07-24 ENCOUNTER — Telehealth: Admit: 2022-07-24 | Payer: PRIVATE HEALTH INSURANCE | Attending: Surgical Critical Care

## 2022-07-27 ENCOUNTER — Ambulatory Visit: Admit: 2022-07-27 | Payer: PRIVATE HEALTH INSURANCE | Attending: Anesthesiology

## 2022-07-27 DIAGNOSIS — K8020 Calculus of gallbladder without cholecystitis without obstruction: Secondary | ICD-10-CM

## 2022-07-29 ENCOUNTER — Ambulatory Visit: Admit: 2022-07-29 | Payer: PRIVATE HEALTH INSURANCE | Attending: Diagnostic Radiology

## 2022-07-29 ENCOUNTER — Encounter: Admit: 2022-07-29 | Payer: PRIVATE HEALTH INSURANCE | Attending: Surgical Critical Care

## 2022-07-29 ENCOUNTER — Ambulatory Visit: Admit: 2022-07-29 | Payer: PRIVATE HEALTH INSURANCE | Attending: Anesthesiology

## 2022-07-29 ENCOUNTER — Inpatient Hospital Stay: Admit: 2022-07-29 | Discharge: 2022-07-29 | Payer: PRIVATE HEALTH INSURANCE | Attending: Surgical Critical Care

## 2022-07-29 ENCOUNTER — Ambulatory Visit: Admit: 2022-07-29 | Payer: PRIVATE HEALTH INSURANCE

## 2022-07-29 DIAGNOSIS — J45909 Unspecified asthma, uncomplicated: Secondary | ICD-10-CM

## 2022-07-29 DIAGNOSIS — Z87891 Personal history of nicotine dependence: Secondary | ICD-10-CM

## 2022-07-29 DIAGNOSIS — E669 Obesity, unspecified: Secondary | ICD-10-CM

## 2022-07-29 DIAGNOSIS — E119 Type 2 diabetes mellitus without complications: Secondary | ICD-10-CM

## 2022-07-29 DIAGNOSIS — Z6831 Body mass index (BMI) 31.0-31.9, adult: Secondary | ICD-10-CM

## 2022-07-29 DIAGNOSIS — R109 Unspecified abdominal pain: Secondary | ICD-10-CM

## 2022-07-29 DIAGNOSIS — K8020 Calculus of gallbladder without cholecystitis without obstruction: Secondary | ICD-10-CM

## 2022-07-29 DIAGNOSIS — Z7984 Long term (current) use of oral hypoglycemic drugs: Secondary | ICD-10-CM

## 2022-07-29 DIAGNOSIS — G8918 Other acute postprocedural pain: Secondary | ICD-10-CM

## 2022-07-29 DIAGNOSIS — K8010 Calculus of gallbladder with chronic cholecystitis without obstruction: Secondary | ICD-10-CM

## 2022-07-29 MED ORDER — LACTATED RINGERS INTRAVENOUS SOLUTION
INTRAVENOUS | Status: DC | PRN
Start: 2022-07-29 — End: 2022-07-29
  Administered 2022-07-29: 16:00:00 via INTRAVENOUS

## 2022-07-29 MED ORDER — ACETAMINOPHEN 500 MG TABLET
500 mg | Status: CP
Start: 2022-07-29 — End: ?
  Administered 2022-07-29: 14:00:00 500 mg via ORAL

## 2022-07-29 MED ORDER — MIDAZOLAM 1 MG/ML INJECTION SOLUTION
1 mg/mL | Status: CP
Start: 2022-07-29 — End: ?

## 2022-07-29 MED ORDER — FENTANYL (PF) 50 MCG/ML INJECTION SOLUTION
50 mcg/mL | INTRAVENOUS | Status: DC | PRN
Start: 2022-07-29 — End: 2022-07-29
  Administered 2022-07-29 (×3): 50 mcg/mL via INTRAVENOUS

## 2022-07-29 MED ORDER — EPHEDRINE SULFATE 5 MG/ML INTRAVENOUS SOLUTION
5 mg/mL | INTRAVENOUS | Status: DC | PRN
Start: 2022-07-29 — End: 2022-07-29
  Administered 2022-07-29: 17:00:00 5 mg/mL via INTRAVENOUS

## 2022-07-29 MED ORDER — DEXAMETHASONE SODIUM PHOSPHATE (PF) 10 MG/ML INJECTION SOLUTION
10 mg/mL | INTRAVENOUS | Status: DC | PRN
Start: 2022-07-29 — End: 2022-07-29
  Administered 2022-07-29: 16:00:00 10 mg/mL via INTRAVENOUS

## 2022-07-29 MED ORDER — PROPOFOL 10 MG/ML INTRAVENOUS EMULSION
10 mg/mL | INTRAVENOUS | Status: DC | PRN
Start: 2022-07-29 — End: 2022-07-29
  Administered 2022-07-29: 16:00:00 10 mg/mL via INTRAVENOUS

## 2022-07-29 MED ORDER — FENTANYL (PF) 50 MCG/ML INJECTION SOLUTION
50 mcg/mL | INTRAVENOUS | Status: DC | PRN
Start: 2022-07-29 — End: 2022-07-30
  Administered 2022-07-29: 19:00:00 50 mL via INTRAVENOUS

## 2022-07-29 MED ORDER — IOHEXOL 300 MG IODINE/ML INTRAVENOUS SOLUTION
300 mg iodine/mL | Status: DC | PRN
Start: 2022-07-29 — End: 2022-07-29
  Administered 2022-07-29: 18:00:00 300 mg iodine/mL via INTRAVENOUS

## 2022-07-29 MED ORDER — CEFAZOLIN 1 GRAM SOLUTION FOR INJECTION
1 gram | INTRAVENOUS | Status: DC | PRN
Start: 2022-07-29 — End: 2022-07-29
  Administered 2022-07-29: 16:00:00 1 gram via INTRAVENOUS

## 2022-07-29 MED ORDER — BUPIVACAINE (PF) 0.25 % (2.5 MG/ML) INJECTION SOLUTION
0.252.5 % (2.5 mg/mL) | PERINEURAL | Status: DC | PRN
Start: 2022-07-29 — End: 2022-07-29
  Administered 2022-07-29 (×2): 0.25 % (2.5 mg/mL) via PERINEURAL

## 2022-07-29 MED ORDER — METHYLPREDNISOLONE SOD SUCC (PF) 40 MG/ML SOLUTION FOR INJECTION
40 mg/mL | Status: DC | PRN
Start: 2022-07-29 — End: 2022-07-29
  Administered 2022-07-29 (×2): 40 mg/mL

## 2022-07-29 MED ORDER — SODIUM CHLORIDE 0.9 % (FLUSH) INJECTION SYRINGE
0.9 % | INTRAVENOUS | Status: DC | PRN
Start: 2022-07-29 — End: 2022-07-30

## 2022-07-29 MED ORDER — KETOROLAC 30 MG/ML (1 ML) INJECTION SOLUTION
301 mg/mL (1 mL) | INTRAVENOUS | Status: DC | PRN
Start: 2022-07-29 — End: 2022-07-29
  Administered 2022-07-29: 18:00:00 30 mg/mL (1 mL) via INTRAVENOUS

## 2022-07-29 MED ORDER — ONDANSETRON HCL (PF) 4 MG/2 ML INJECTION SOLUTION
42 mg/2 mL | INTRAVENOUS | Status: DC | PRN
Start: 2022-07-29 — End: 2022-07-29
  Administered 2022-07-29: 18:00:00 4 mg/2 mL via INTRAVENOUS

## 2022-07-29 MED ORDER — LIDOCAINE HCL 10 MG/ML (1 %) INJECTION SOLUTION
101 mg/mL (1 %) | Freq: Once | INTRADERMAL | Status: DC
Start: 2022-07-29 — End: 2022-07-30

## 2022-07-29 MED ORDER — OXYCODONE IMMEDIATE RELEASE 5 MG TABLET
5 mg | Freq: Once | ORAL | Status: DC | PRN
Start: 2022-07-29 — End: 2022-07-30

## 2022-07-29 MED ORDER — IBUPROFEN 600 MG TABLET
600 mg | ORAL_TABLET | Freq: Four times a day (QID) | ORAL | 1 refills | Status: AC | PRN
Start: 2022-07-29 — End: ?

## 2022-07-29 MED ORDER — CHLORHEXIDINE GLUCONATE 0.12 % MOUTHWASH
0.12 % | Freq: Once | OROMUCOSAL | Status: CP
Start: 2022-07-29 — End: ?
  Administered 2022-07-29: 14:00:00 0.12 mL via OROMUCOSAL

## 2022-07-29 MED ORDER — ACETAMINOPHEN 325 MG TABLET
325 mg | ORAL_TABLET | Freq: Four times a day (QID) | ORAL | 1 refills | Status: AC | PRN
Start: 2022-07-29 — End: ?

## 2022-07-29 MED ORDER — POLYETHYLENE GLYCOL 3350 17 GRAM ORAL POWDER PACKET
17 gram | Freq: Every day | ORAL | 3 refills | Status: AC
Start: 2022-07-29 — End: ?

## 2022-07-29 MED ORDER — LIDOCAINE-EPINEPHRINE (PF) 1 %-1:200,000 INJECTION SOLUTION
1 %-:200,000 | Status: CP
Start: 2022-07-29 — End: ?

## 2022-07-29 MED ORDER — FENTANYL (PF) 50 MCG/ML INJECTION SOLUTION
50 mcg/mL | INTRAVENOUS | Status: DC | PRN
Start: 2022-07-29 — End: 2022-07-30

## 2022-07-29 MED ORDER — LIDOCAINE-EPINEPHRINE (PF) 1 %-1:200,000 INJECTION SOLUTION
1 %-:200,000 | Status: DC | PRN
Start: 2022-07-29 — End: 2022-07-29
  Administered 2022-07-29: 18:00:00 1 %-:200,000

## 2022-07-29 MED ORDER — HEPARIN (PORCINE) 5,000 UNIT/ML INJECTION SOLUTION
5000 unit/mL | Freq: Once | SUBCUTANEOUS | Status: CP
Start: 2022-07-29 — End: ?
  Administered 2022-07-29: 14:00:00 via SUBCUTANEOUS

## 2022-07-29 MED ORDER — ACETAMINOPHEN 500 MG TABLET
500 mg | Freq: Once | ORAL | Status: CP
Start: 2022-07-29 — End: ?
  Administered 2022-07-29: 14:00:00 500 mg via ORAL

## 2022-07-29 MED ORDER — ROCURONIUM 10 MG/ML INTRAVENOUS SOLUTION
10 mg/mL | INTRAVENOUS | Status: DC | PRN
Start: 2022-07-29 — End: 2022-07-29
  Administered 2022-07-29 (×4): 10 mg/mL via INTRAVENOUS

## 2022-07-29 MED ORDER — DEXAMETHASONE SODIUM PHOSPHATE (PF) 10 MG/ML INJECTION SOLUTION
10 mg/mL | PERINEURAL | Status: DC | PRN
Start: 2022-07-29 — End: 2022-07-29
  Administered 2022-07-29 (×2): 10 mg/mL via PERINEURAL

## 2022-07-29 MED ORDER — LIDOCAINE (PF) 20 MG/ML (2 %) INJECTION SOLUTION
202 mg/mL (2 %) | INTRAVENOUS | Status: DC | PRN
Start: 2022-07-29 — End: 2022-07-29
  Administered 2022-07-29: 16:00:00 20 mg/mL (2 %) via INTRAVENOUS

## 2022-07-29 MED ORDER — LACTATED RINGERS INTRAVENOUS SOLUTION
INTRAVENOUS | Status: DC
Start: 2022-07-29 — End: 2022-07-30

## 2022-07-29 MED ORDER — FENTANYL (PF) 50 MCG/ML INJECTION SOLUTION
50 mcg/mL | Status: CP
Start: 2022-07-29 — End: ?

## 2022-07-29 MED ORDER — OXYCODONE IMMEDIATE RELEASE 5 MG TABLET
5 mg | ORAL_TABLET | ORAL | 1 refills | Status: AC | PRN
Start: 2022-07-29 — End: ?

## 2022-07-29 MED ORDER — SODIUM CHLORIDE 0.9 % (FLUSH) INJECTION SYRINGE
0.9 % | Freq: Three times a day (TID) | INTRAVENOUS | Status: DC
Start: 2022-07-29 — End: 2022-07-30

## 2022-07-29 MED ORDER — PHENYLEPHRINE 1 MG/10 ML (100 MCG/ML) IN 0.9 % SOD.CHLORIDE IV SYRINGE
110100 mg/0 mL (00 mcg/mL) | INTRAVENOUS | Status: DC | PRN
Start: 2022-07-29 — End: 2022-07-29
  Administered 2022-07-29 (×2): 1 mg/0 mL (00 mcg/mL) via INTRAVENOUS

## 2022-07-29 MED ORDER — NALOXONE 0.4 MG/ML INJECTION SOLUTION
0.4 mg/mL | INTRAVENOUS | Status: DC | PRN
Start: 2022-07-29 — End: 2022-07-30

## 2022-07-29 MED ORDER — HEPARIN (PORCINE) 5,000 UNIT/ML INJECTION SOLUTION
5000 unit/mL | Status: CP
Start: 2022-07-29 — End: ?
  Administered 2022-07-29: 14:00:00 via SUBCUTANEOUS

## 2022-07-29 MED ORDER — MIDAZOLAM 1 MG/ML INJECTION SOLUTION
1 mg/mL | INTRAVENOUS | Status: DC | PRN
Start: 2022-07-29 — End: 2022-07-29
  Administered 2022-07-29: 16:00:00 1 mg/mL via INTRAVENOUS

## 2022-07-29 MED ORDER — CHLORHEXIDINE GLUCONATE 0.12 % MOUTHWASH
0.12 % | Status: CP
Start: 2022-07-29 — End: ?
  Administered 2022-07-29: 14:00:00 0.12 % via OROMUCOSAL

## 2022-07-29 MED ORDER — CHLORHEXIDINE GLUCONATE 2 % TOWELETTE
2 % | Freq: Once | TOPICAL | Status: DC
Start: 2022-07-29 — End: 2022-07-30

## 2022-07-29 MED ORDER — SUGAMMADEX 100 MG/ML INTRAVENOUS SOLUTION
100 mg/mL | INTRAVENOUS | Status: DC | PRN
Start: 2022-07-29 — End: 2022-07-29
  Administered 2022-07-29: 18:00:00 100 mg/mL via INTRAVENOUS

## 2022-07-29 NOTE — Anesthesia Procedure Notes
Room and Bed: [facility] PERIOP , Pool  Current Location: [facility] MAIN OR Anesthesia Block:Date/Time: 07/29/2022 12:06 PM Subcostal TAPsPain Diagnosis/Location: Lap CholeThis block procedure is being performed for post-operative pain management at the request of:      Requesting Physician: Horris Latino, MDPre-procedure Checklistpatient identified; site marked; surgical consent reviewd; pre-op evaluation performed; Universal Protocol/timeout performed; IV checked; monitors and equipment checked; informed consent obtained and options, plan, risks & benefits discussed with patientUniversal ProtocolUniversal protocol documented by nurse: noTime out unable to be performed due to emergent nature of case: NoTiming: the time out is initiated after the patient is positioned and prior to the beginning of the procedure: YesName of patient and medical record number or DOB (if MRN is unavailable) stated and checked with ID band or        previously confirmed MEDICAL RECORD NUMBERYesProceduralist states or confirms the procedure to be performed: YesThe procedural consent is used to verify the procedure to be performed: YesSite of procedure(s) (with laterally or level) is topically marked per policy and visible after draping: N/ARadiographic imaging is present or a laterality side/site band is present on patient and is accessible: N/AMedications addressed: YesBlood Addressed: YesImaging addressed: N/AImplants addressed: N/ASpecial equipment or other needs addressed: N/AProceduralist discusses particular challenges or special considerations: YesBaseline Neurological Deficits:NonePre-op Anti-coagulation ZOX:[id number] pre-procedure mental status:  Under general anesthesiaPrep: chloraprepNeedle A:  Subcostal TAPsLaterality: N/AInjection technique: single-shotNeedle: Short-bevel      Gauge: 20 G      Length: 4 inTechnique: Ultrasound guided - Imaging guidance was used to decrease the risk of complications. A permanent image has been stored in the patient's medical record. The needle tip was noted to be adjacent to the nerve/plexus identified. Appropriate spread of the medication was noted in real time. There was no ultrasound evidence of intravascular and/or intraneural injection. Event(s): blood not aspirated, no injection resistance and no other eventPerformed By:     - Anesthesiologist: Sheppard Evens, MD, personally, with Attending Anesthesiologist and with CRNA     - CRNA: Lewanda Rife, CRNAPatient's position for procedure:supineOutcome:  successful blockAttempts:  block completePatient tolerated block procedure well?:  yes

## 2022-07-29 NOTE — Anesthesia Post-Procedure Evaluation
Anesthesia Post-op NotePatient: Tracy Miller SantosProcedure(s):  Procedure(s) (LRB):ERAS LAP,CHOLECYSTECTOMY/GRAPH (N/A) Last Vitals:  I have reviewed the post-operative vital signs as noted in the Epic chart.POSTOP EVALUATION:      Patient Recovery Location:  PACU     Vital Signs Status:  Stable     Patient Participation:  Patient participated     Mental Status:  Awake     Respiratory Status:  Acceptable     Airway Patency:  Patent     Cardiovascular/Hydration Status:  Stable     Pain Management:  Satisfactory to patient     Nausea/Vomiting Status:  Satisfactory to patientThere were no known notable events for this encounter.

## 2022-07-29 NOTE — Anesthesia Pre-Procedure Evaluation
This is a 67 y.o. male scheduled for ERAS LAP,CHOLECYSTECTOMY/GRAPH.Review of Systems/ Medical HistoryPatient summary, Labs, pre-procedure vitals, height, weight and NPO status reviewed.No previous anesthesia concernsAnesthesia Evaluation:   No history of anesthetic complications  Estimated body mass index is 31.55 kg/m? as calculated from the following:  Height as of this encounter: 5' 5 (1.651 m).  Weight as of this encounter: 86 kg. CC/HPI: 67 year old male with hx of asthma, DM presents with cholecystitis for planned ERAS LAP,CHOLECYSTECTOMY/GRAPHPast Surgical History:  Past Surgical History:No date: HERNIA REPAIR    Comment:  UmbilicalCardiovascular: Negative      -Exercise tolerance: >4 METS -Vascular Disease:  Negative    Respiratory: -Airway disorders:      -Asthma: yesSkeletal/Skin:  NegativeGastrointestinal/Genitourinary: -Gastrointestinal Disorders:  Patient has GERD.-Nutritional Disorders: Pt is obese per BMI definition-Hematological/Lymphatic: NegativeEndocrine/Metabolic: -Diabetes mellitus:  Patient has diabetes mellitus. His diabetes is well controlled.Behavioral/Social/Psychiatric & Syndromes: NegativeAdditional Findings: VITAL SIGNS:Temp:  [36.9 ?C] 36.9 ?CPulse:  [57] 57Resp:  [16] 16BP: (150)/(83) 150/83SpO2:  [98 %] 98 %Device (Oxygen Therapy): room airLABS:Lab Results     Component                Value               Date                     WBC                      12.1 (H)            06/23/2022               HGB                      16.0                06/23/2022               HCT                      45.60               06/23/2022               PLT                      256                 06/23/2022               CHOL                     190                 02/17/2022               TRIG                     70                  02/17/2022               HDL 59                  02/17/2022               LDL  118 (H)             02/17/2022               ALT                      14                  06/23/2022               AST                      14                  06/23/2022               NA                       134 (L)             06/23/2022               K                        3.4                 06/23/2022               CL                       96 (L)              06/23/2022               CREATININE               0.60                06/23/2022               BUN                      17                  06/23/2022               CO2                      25                  06/23/2022               TSH                      2.450               02/17/2022               PSA                      0.635               02/17/2022               GLU                      114 (H)             07/29/2022  HGBA1C                   5.9 (H)             02/17/2022          Physical ExamCardiovascular:      Rhythm: regularHeart Sounds: S1 present and S2 present.Pulmonary:    Patient's breath sounds clear to auscultationAirway:  Mallampati: IITM distance: >3 FBNeck ROM: fullMouth Opening: >3cmDental:  Dentition: dentures lower and dentures upperAnesthesia PlanASA 2 The primary anesthesia plan is  general. Perioperative Code Status confirmed: It is my understanding that the patient is currently designated as 'Full Code' and will remain so throughout the perioperative period.Anesthesia informed consent obtained. Consent obtained from: patientUse of blood products: consented  Consent Comment: Risks, Benefits and alternatives to PNB for post-op analgesia discussed, patient wishes to proceed pending discussion with Dr. Tana Coast post operative pain plan is per surgeon management, perineural and IV analgesics.The post operative pain plan was performed at surgeon's request.Plan discussed with Attending and CRNA.Anesthesiologist's Pre Op NoteI personally evaluated and examined the patient prior to the intra-operative phase of care on the day of the procedure.Marland Kitchen

## 2022-07-29 NOTE — Discharge Instructions
You will be the most sore for 2-3 days after the surgery.  You should take Tylneol and motrin alternating every 3 hours for the first 2-3 days.  You can place ice to the incisions to help reduce inflammation.  You will have an abdominal binder when he wakes up which will give extra support while he is moving around and help to reduce fluid buildup in the wound. While fluid build up is not dangerous, it can make things more uncomfortbale. For the first week you will be more tired than usual and may take more naps as your body is working to heal.  You may also have less of an appetite which is normal. You are encourgaed to drink plenty of fluids.  You will get a script for oxycodone which does not need to take if he doesn't need it. take a laxative if you do need it as it can be very constipating.  Most patients tend to start feeling better by the end of the second week and you will be coming in to see me around that timeDiet:  - regular diet as tolerated You may be more comfortable eating smaller, more frequent meals. Drink plenty of fluids.Activity Instructions - No swimming or hot tub use or tub bathing until follow-up with your surgeon. - no heavy lifting > 10 lbs for 2 months after surgery.   - No driving or operating heavy machinery while taking narcotics pain medications. - You may climb stairs - Be sure to take over the counter stool softeners (senna, colace, and miralax) as directed to soften stool while on narcotic pain medication.  If you experience loose or liquid stool stop these medications.Wound Care: - your wound is covered with steri strips, telfa (white covering) and tegaderm (clear bandage). You may remove the telfa and tegaderm 48 hours after surgery. The steri strips will fall off on their own  - Cover surgical area with dry gauze as needed.   - Observe wound for proper healing.   - Call physician if wound becomes red, tender, warm, or drains purulent fluid.   - You may shower the morning after surgery and gently cleanse the area however do not scrub.  Pat dry, do not rub. Special Instructions: - Call MD or go to the ED if fever greater than 101.4, shaking chills, separation of staples/sutures, diffuse bleeding or foul smelling discharge from the surgical sites, increase redness, increase abdominal distension, intractable nausea/vomiting, chest pain, shortness of breath, or pain not controlled with oral pain medications.

## 2022-07-29 NOTE — Brief Op Note
[facility]Patient [facility]: [facility]        [id number] Patient DOB: 08/26/1955     Surgery Date: 5/28/2024Surgeon(s) and Role:   * Horris Latino, MD - PrimaryAssistant(s):Resident: Ananias Pilgrim, MDStaff:  Circulator: Ferman Hamming, RNRadiology Technologist: Debroah Loop Circulator: Elenore Paddy, RNRelief Scrub: Alinda Money, RNScrub Person: Vick, CurtisPre-Op Diagnosis: * No pre-op diagnosis entered * Procedure(s) and Anesthesia Type:   * ERAS LAP,CHOLECYSTECTOMY/GRAPH - GENERALOperative Findings (enter relevant operative findings; do not refer to an operative report that is not yet transcribed): lap chole with IOCSigns of infection present at the time of surgery at the operative site: None Blood and Blood Products: none                 Drains:  noneImplants: * No implants in log * Specimens: ID Type Source Tests Collected by Time 1 : gallbladder Tissue Gallbladder PATHOLOGY [facility] Christain [place], MD 07/29/2022 12:27 PM  Clinical Staging: n/aEBL: Minimal      Post Operative Diagnosis: * No post-op diagnosis entered * Hermena Swint Ur Brandi Armato, MD5/28/20242:16 PM

## 2022-07-29 NOTE — Other
Operative Diagnosis:Pre-op:   * No pre-op diagnosis entered * Patient Coded Diagnosis   Pre-op diagnosis: Calculus of gallbladder without cholecystitis without obstruction  Post-op diagnosis: Calculus of gallbladder without cholecystitis without obstruction  Patient Diagnosis   None    Post-op diagnosis:   * Calculus of gallbladder without cholecystitis without obstruction [K80.20]Operative Procedure(s) :Procedure(s) (LRB):ERAS LAP,CHOLECYSTECTOMY/GRAPH (N/A)Post-op Procedure & Diagnosis ConfirmationPost-op Diagnosis: Post-op Diagnosis updated (see notes)     - Calculus of gallbladder without cholecystitis without obstruction [K80.20]Post-op Procedure: Post-op Procedure updated (see notes)     - ERAS LAP,CHOLECYSTECTOMY/GRAPH - WITH CLOANGIOGRAM

## 2022-07-29 NOTE — Other
Post Anesthesia Transfer of Care NotePatient: Tracy Miller SantosProcedure(s) Performed: Procedure(s) (LRB):ERAS LAP,CHOLECYSTECTOMY/GRAPH (N/A)Last Vitals: I have reviewed the post-operative vital signs during the handoff as noted in the Epic chart.POSTOP HANDOFF :      Patient Location:  PACU     Level of Consciousness:  Awake, alert and oriented     VS stable since last recorded intra-op set? Yes       Oxygen source: maskPatient co-morbidities, intra-operative course, intake & output and antibiotics as per Anesthesia record were discussed with the RN.

## 2022-07-30 NOTE — Other
Laparoscopic Cholecystectomy with intraoperative cholangiogram5/28/24Indications: The patient was seen in my office with a history of RUQ abdominal pain after eating fatty meals, as well as an episode of gallstone pancreatitis while in [place]. A RUQ US revealed findings of cholelithiasis. The patient now presents for the above surgery after discussing therapeutic alternatives.      Surgeon: Horris Latino, MD Assistants: Dr. Rozann Lesches: General endotracheal anesthesiaProcedure Details After appropriate consent was obtained, the patient was taken to the OR and placed in the supine position on the OR table.  She underwent induction of anesthesia without problem and was intubated.  She was then prepped and draped in sterile fashion.  A subxiphoid incision was made using a scalpel and blunt dissection used to dissect down to the abdominal wall fascia.  2 Kocher clamps were used to grasp the fascia, and a Metz used to incisie it.  A 0 Vircyl suture was then placed in a figure of 8 fashion x 2 through the fascia and the peritoneum grasped with Kelly clamps and incisied.  A finger sweep was performed with no adhesions noted.  A 12 mm port was then inserted and insufflation achieved.  A scope was then inserted, and the area under the port was inspected with no visualization of any injury.  3 additional 5 mm ports were then placed in standard fashion.  We then turned our attention to the RUQ.  The gallbladder was grasped and retracted over the liver.  Careful blunt and electrocautery dissection was undertaken and the cystic duct, artery, and node was visualized.  Once the critical view of safety was achieved with 2 isolated structures going into the gallbladder, 5mm clips were used with 2 below and 1 above to transect the artery.  A ductotomy was made very high on the duct at the junction with the gallbladder and a cholangiocatheter placed.  A cholangiogram was then performed revealing filling of the CBD, common hepatic, and right and left hepatic ducts.  The contrast emptied into the SB and there were no filling defects noted.  Given this, the catheter was removed and 3 clips placed high, just below the ductotomy.  Once cut, both structures were noted to have a single lumen and the artery was pulsating.  Next, the gallbladder was dissected off of the liver bed using blunt dissection and electrocautery.  The specimen was then removed through the subxiphoid port using an endocatch bag.  Next, the area around the liver was gently irrigated and suctioned.  Finally, the 3 x 5mm ports were removed under direct visualization with no bleeding noted.  The abdomen was deflated, and the fascia at the subxiphoid port site closed by tying down the 0 Vicryl sutures.  Next, all skin incisions were closed with a 4-0 monocryl.  A dressing of mastisol, steristrips, telfa and tegaderm was applied.  The patient was awoken from anesthesia and brought to the PACU in stable condition.Findings:IOC with no findings of choledocholithiasis.Estimated Blood Loss:  Minimal       Drains: none              Specimens: gallbladder        Implants: none       Complications:  none       Disposition: PACU - hemodynamically stable.       Condition: stable

## 2022-07-30 NOTE — Interval H&P Note
Subsequent to admission for surgery or invasive procedure, I have reassessed the patient by examination and review of relevant data pertaining to the planned procedure. I have verified the planned procedure and there are no relevant changes since the H&P. Feels well today. Denies fever, chills, cp/sob, n/v. Last ate yesterday .PE: Gen: NADCV: RRRPulm: CTABAbd: soft, mild ttp epigastric region, palpable umbilical hernia repair (10 years ago per pt), nondistended, well healed prior surgical incisionExt: wwpPlan:- proceed to OR- consent signed in clinic and scanned into chart- no site marking necessary

## 2022-08-08 ENCOUNTER — Encounter: Admit: 2022-08-08 | Payer: PRIVATE HEALTH INSURANCE | Attending: Surgical Critical Care

## 2023-04-01 ENCOUNTER — Inpatient Hospital Stay: Admit: 2023-04-01 | Discharge: 2023-04-01 | Payer: PRIVATE HEALTH INSURANCE

## 2023-04-01 ENCOUNTER — Encounter: Admit: 2023-04-01 | Payer: PRIVATE HEALTH INSURANCE

## 2023-04-01 DIAGNOSIS — N342 Other urethritis: Secondary | ICD-10-CM

## 2023-04-02 LAB — URINE CULTURE: BKR URINE CULTURE, ROUTINE: NO GROWTH

## 2023-04-06 ENCOUNTER — Encounter: Admit: 2023-04-06 | Payer: PRIVATE HEALTH INSURANCE

## 2023-04-06 ENCOUNTER — Inpatient Hospital Stay: Admit: 2023-04-06 | Discharge: 2023-04-06 | Payer: PRIVATE HEALTH INSURANCE

## 2023-04-06 DIAGNOSIS — E119 Type 2 diabetes mellitus without complications: Secondary | ICD-10-CM

## 2023-04-06 LAB — HEMOGLOBIN A1C
BKR ESTIMATED AVERAGE GLUCOSE: 126 mg/dL
BKR HEMOGLOBIN A1C: 6 % — ABNORMAL HIGH (ref 4.0–5.6)

## 2023-04-06 LAB — ALBUMIN/CREATININE PANEL, URINE, RANDOM
BKR ALBUMIN, URINE, RANDOM: 70.1 mg/L
BKR ALBUMIN/CREATININE RATIO, URINE, RANDOM: 49.1 mg/g{creat} — ABNORMAL HIGH (ref <30.0)
BKR CREATININE, URINE, RANDOM: 143 mg/dL

## 2023-05-01 ENCOUNTER — Inpatient Hospital Stay: Admit: 2023-05-01 | Discharge: 2023-05-01 | Payer: PRIVATE HEALTH INSURANCE

## 2023-05-01 ENCOUNTER — Emergency Department: Admit: 2023-05-01 | Payer: PRIVATE HEALTH INSURANCE

## 2023-05-01 ENCOUNTER — Inpatient Hospital Stay
Admit: 2023-05-01 | Discharge: 2023-05-01 | Payer: PRIVATE HEALTH INSURANCE | Attending: Student in an Organized Health Care Education/Training Program

## 2023-05-01 DIAGNOSIS — R002 Palpitations: Secondary | ICD-10-CM

## 2023-05-01 DIAGNOSIS — R0789 Other chest pain: Secondary | ICD-10-CM

## 2023-05-01 DIAGNOSIS — Z28311 Partially vaccinated for covid-19: Secondary | ICD-10-CM

## 2023-05-01 DIAGNOSIS — R0602 Shortness of breath: Secondary | ICD-10-CM

## 2023-05-01 LAB — COMPREHENSIVE METABOLIC PANEL
BKR A/G RATIO: 1.2 (ref 1.0–2.2)
BKR ALANINE AMINOTRANSFERASE (ALT): 20 U/L (ref 14–63)
BKR ALBUMIN: 4 g/dL (ref 3.4–5.0)
BKR ALKALINE PHOSPHATASE: 64 U/L (ref 46–116)
BKR ANION GAP: 9 (ref 5–16)
BKR ASPARTATE AMINOTRANSFERASE (AST): 16 U/L (ref 10–42)
BKR AST/ALT RATIO: 0.8
BKR BILIRUBIN TOTAL: 1.9 mg/dL — ABNORMAL HIGH (ref 0.2–1.2)
BKR BLOOD UREA NITROGEN: 25 mg/dL — ABNORMAL HIGH (ref 7–18)
BKR BUN / CREAT RATIO: 36.2 — ABNORMAL HIGH (ref 8.0–23.0)
BKR CALCIUM: 8.8 mg/dL (ref 8.5–10.5)
BKR CHLORIDE: 105 mmol/L (ref 98–107)
BKR CO2: 27 mmol/L (ref 21–32)
BKR CREATININE DELTA: 0.09
BKR CREATININE: 0.69 mg/dL — ABNORMAL LOW (ref 0.80–1.30)
BKR EGFR, CREATININE (CKD-EPI 2021): >60 mL/min/{1.73_m2} (ref >=60)
BKR GLOBULIN: 3.3 g/dL (ref 2.3–3.5)
BKR GLUCOSE: 110 mg/dL — ABNORMAL HIGH (ref 70–100)
BKR POTASSIUM: 3.7 mmol/L (ref 3.5–5.1)
BKR PROTEIN TOTAL: 7.3 g/dL (ref 6.0–8.0)
BKR SODIUM: 141 mmol/L (ref 136–145)

## 2023-05-01 LAB — CBC WITH AUTO DIFFERENTIAL
BKR WAM ABSOLUTE IMMATURE GRANULOCYTES.: 0.01 x 1000/ÂµL (ref 0.00–0.30)
BKR WAM ABSOLUTE LYMPHOCYTE COUNT.: 3.91 x 1000/ÂµL — ABNORMAL HIGH (ref 0.60–3.70)
BKR WAM ABSOLUTE NRBC (2 DEC): 0 x 1000/ÂµL (ref 0.00–1.00)
BKR WAM ANC (ABSOLUTE NEUTROPHIL COUNT): 5.65 x 1000/ÂµL (ref 2.00–7.60)
BKR WAM BASOPHIL ABSOLUTE COUNT.: 0.08 x 1000/ÂµL (ref 0.00–1.00)
BKR WAM BASOPHILS: 0.7 % (ref 0.0–1.4)
BKR WAM EOSINOPHIL ABSOLUTE COUNT.: 0.2 x 1000/ÂµL (ref 0.00–1.00)
BKR WAM EOSINOPHILS: 1.9 % (ref 0.0–5.0)
BKR WAM HEMATOCRIT (2 DEC): 45.5 % (ref 38.50–50.00)
BKR WAM HEMOGLOBIN: 15.5 g/dL (ref 13.2–17.1)
BKR WAM IMMATURE GRANULOCYTES: 0.1 % (ref 0.0–1.0)
BKR WAM LYMPHOCYTES: 36.5 % (ref 17.0–50.0)
BKR WAM MCH (PG): 31.4 pg (ref 27.0–33.0)
BKR WAM MCHC: 34.1 g/dL (ref 31.0–36.0)
BKR WAM MCV: 92.1 fL (ref 80.0–100.0)
BKR WAM MONOCYTE ABSOLUTE COUNT.: 0.85 x 1000/ÂµL (ref 0.00–1.00)
BKR WAM MONOCYTES: 7.9 % (ref 4.0–12.0)
BKR WAM MPV: 11.9 fL (ref 8.0–12.0)
BKR WAM NEUTROPHILS: 52.9 % (ref 39.0–72.0)
BKR WAM NUCLEATED RED BLOOD CELLS: 0 % (ref 0.0–1.0)
BKR WAM PLATELETS: 223 x1000/ÂµL (ref 150–420)
BKR WAM RDW-CV: 11.9 % (ref 11.0–15.0)
BKR WAM RED BLOOD CELL COUNT.: 4.94 M/ÂµL (ref 4.00–6.00)
BKR WAM WHITE BLOOD CELL COUNT: 10.7 x1000/ÂµL (ref 4.0–11.0)

## 2023-05-01 LAB — MAGNESIUM: BKR MAGNESIUM: 2.1 mg/dL (ref 1.8–2.5)

## 2023-05-01 LAB — TROPONIN T HIGH SENSITIVITY, 1 HOUR WITH REFLEX (BH GH LMW YH)
BKR TROPONIN T HS 1 HOUR DELTA FROM 0 HOUR: 0 ng/L
BKR TROPONIN T HS 1 HOUR: <6 ng/L

## 2023-05-01 LAB — TROPONIN T HIGH SENSITIVITY, 0 HOUR BASELINE WITH REFLEX (BH GH LMW YH): BKR TROPONIN T HS 0 HOUR BASELINE: 6 ng/L

## 2023-05-01 LAB — BILIRUBIN, DIRECT: BKR BILIRUBIN DIRECT: 0.3 mg/dL (ref 0.0–0.4)

## 2023-05-01 LAB — PROTIME AND INR
BKR INR: 1.04 (ref 0.93–1.20)
BKR PROTHROMBIN TIME: 10.6 s (ref 9.5–12.1)

## 2023-05-01 LAB — LIPASE: BKR LIPASE: 28 U/L (ref 16–77)

## 2023-05-01 LAB — TSH W/REFLEX TO FT4     (BH GH LMW Q YH): BKR THYROID STIMULATING HORMONE: 2.21 u[IU]/mL

## 2023-05-01 NOTE — ED Notes
 7:08 PM- Assume care and received bedside report from Crystal, RN. 7:20 PM- Pt off unit for imaging. 7:35 PM- Pt brought into ED c/o left sided chest pain that started last night and unable to sleep to discomfort. Pt stated that the pain and discomfort radiates to his right shoulder. No medical history of chest pains. Pt is A&O x's 4, afebrile, no vomiting, no diarrhea. No signs of respiratory distress noted. Call bell within reach, bed rails up and bed in lowest setting. Wife and family at bedside. Labs obtained and sent. 8:30 PM- 2nd Troponin labs drawn and sent9:40 PM- Pt ready for discharge. Discharge papers and instructions provided by provider. IV removed with catheter intact. Vital signs updated. Pt denies any pain. Pt ambulated with strong steady gait to exit.

## 2023-05-01 NOTE — Discharge Instructions
PLEASE GO TO THE ED FOR FURTHER EVALUATION.

## 2023-05-01 NOTE — Discharge Instructions
 Please monitor for any passing out, chest pain, difficulty breathing, and return to the ER if you develop any of these symptoms.Otherwise follow up with the primary care doctor, you may need an outpatient Holter monitor.

## 2023-05-01 NOTE — ED Notes
 10:10 PM- Lab called after patient was discharged. PT-INR results corrected from 10.8 to 10.6.

## 2023-05-01 NOTE — ED Notes
Referred to [facility] ED now for chest pain.

## 2023-05-02 NOTE — ED Provider Notes
 Chief Complaint Patient presents with  Chest Pain   Chest pain since last night radiating to right shoulder, with intermittent sob. HPI/PE:The patient is a 68 year old male with diabetes, hypertension, and asthma presenting with intermittent chest pressure and sharp pain radiating to the right shoulder, accompanied by shortness of breath since last night. Given his cardiovascular risk factors, ACS remains the primary concern, though PE, pericarditis, and GERD are also considerations. The pain is not reproducible on exam, and he denies cough or systemic symptoms. His vitals show elevated BP (143/92) but stable heart rate and oxygenation. Given his risk factors and atypical presentation, further cardiac and pulmonary evaluation is required.Due to the potential for life-threatening conditions, the patient requires urgent ED evaluation for ECG, cardiac enzymes, chest X-ray, and possibly a [place] pulmonary angiogram. While GERD or musculoskeletal pain remains possible, ACS must be ruled out first. He was strongly advised to go to the ED immediately, as delayed care could increase risk. He understands the risks and was counseled on red flag symptoms requiring immediate attention.  Physical ExamED Triage Vitals [05/01/23 1814]BP: (!) 143/92Pulse: 74Pulse from  O2 sat: n/aResp: 16Temp: 98.3 ?F (36.8 ?C)Temp src: n/aSpO2: 96 % BP (!) 143/92  - Pulse 74  - Temp 98.3 ?F (36.8 ?C)  - Resp 16  - SpO2 96% Physical ExamVitals reviewed. Constitutional:     General: He is not in acute distress.   Appearance: He is well-developed. HENT:    Head: Normocephalic. Eyes:    Conjunctiva/sclera: Conjunctivae normal. Cardiovascular:    Rate and Rhythm: Normal rate and regular rhythm.    Comments:  Respiratory: Lungs clear to auscultation bilaterally, no wheezing, rales, or rhonchi. No signs of respiratory distress. Chest: No tenderness to palpationPulmonary: Effort: Pulmonary effort is normal. No respiratory distress.    Breath sounds: Normal breath sounds. Abdominal:    Palpations: Abdomen is soft.    Tenderness: There is no abdominal tenderness. Musculoskeletal:       General: Normal range of motion.    Cervical back: Normal range of motion and neck supple. Skin:   General: Skin is warm and dry. Neurological:    Mental Status: He is alert and oriented to person, place, and time.    Comments: Non focal; moving all extremities  ProceduresAttestation/Critical CareClinical Impressions as of 05/02/23 1826 Chest pain, unspecified type  ED DispositionTransfer To Another Facility  Arletta Bale, PA03/01/25 1827

## 2023-05-04 NOTE — ED Provider Notes
 Chief Complaint Patient presents with  Chest Pain   L sided chest pain started last night, preventing patient from sleeping. States he began diaphoretic with initial onset. Patient endorsing R arm pain with intermittent numbness or tingling to R hands. Endorses intermittent SOB. Denies N/V. Denies fever/chills.  HPI/PE:68 year old male with a past medical history significant for diabetes, hypertension, asthma presents to the emergency department today referred by the urgent care for intermittent chest discomfort with episodes of palpitations and associated shortness of breath.  Patient was states he also has been having a sharp pain in the right shoulder intermittently.  States he was signs worsening chest discomfort when lying supine, although states he sleeps on his right side.  Patient was states these episodes last for about 5-10 minutes before spontaneously resolving.Denies fever/chills, neck pain/stiffness, dizziness/lightheadedness, back pain, abdominal pain, nausea/vomiting, leg swelling, diarrhea/constipation, urinary symptomsMDM:Differential: Arrhythmia, costochondritis, pneumonia, low suspicion for PE, heart failure, ACSLabs: CBC, CMP, troponin, pro BNP, Magnesium, PT/INR, lipase, TSHImaging: EKG, CXRLabs reviewed.  CMP revealed no evidence of electrolyte derangement, kidney dysfunction or transaminitis, did reveal elevated total bilirubin of 1.9.  Magnesium resulted with a normal limits at 2.1.  Lipase resulted with a normal limits at 28.  Direct bilirubin resulted with a normal limits at 0.3.  Initial troponin resulted 6, reflex resulted less than 6.  TSH resulted with a normal limits at 2.2.  CBC revealed no evidence of leukocytosis or anemia.  PT/INR resulted within normal limits.EKG reviewed and compared to prior, no evidence of acute ischemia.CXR revealed no evidence of acute Cardiothoracic abnormality.Overall workup today reassuring.  We will refer patient to Cardiology for further evaluation and management.  Patient will likely need a Holter monitor to evaluate for paroxysmal arrhythmia.Discussed dietary and supportive measures including but not limited to decrease caffeine intake and avoiding strenuous exercise until further evaluated by Cardiology.Vitals stable for discharge.  Return precautions advised.Patient provided verbal understanding and agreement with the plan.  Physical ExamED Triage Vitals [05/01/23 1857]BP: 137/88Pulse: 72Pulse from  O2 sat: n/aResp: 18Temp: 97.8 ?F (36.6 ?C)Temp src: OralSpO2: 97 % BP 137/88  - Pulse 72  - Temp 97.8 ?F (36.6 ?C) (Oral)  - Resp 18  - Ht 5' 6 (1.676 m)  - Wt 81.6 kg (180 lb)  - SpO2 97%  - BMI 29.05 kg/m? Physical ExamVitals and nursing note reviewed. Constitutional:     General: He is not in acute distress.   Appearance: He is well-developed and normal weight. He is not ill-appearing, toxic-appearing or diaphoretic. HENT:    Head: Normocephalic and atraumatic. Cardiovascular:    Rate and Rhythm: Normal rate and regular rhythm.    Pulses: Normal pulses.    Heart sounds: Normal heart sounds. Heart sounds not distant. No murmur heard.   No friction rub. No gallop. Pulmonary:    Effort: Pulmonary effort is normal. No tachypnea, accessory muscle usage or respiratory distress.    Breath sounds: Normal breath sounds. No stridor. No decreased breath sounds, wheezing, rhonchi or rales. Chest:    Chest wall: No mass or tenderness. Abdominal:    General: Bowel sounds are normal.    Palpations: Abdomen is soft. There is no mass.    Tenderness: There is no abdominal tenderness. There is no guarding. Musculoskeletal:       General: Normal range of motion.    Cervical back: Normal range of motion and neck supple. No rigidity or tenderness.    Right lower leg: No tenderness. No edema.    Left lower  leg: No tenderness. No edema. Skin:   General: Skin is warm and dry.    Capillary Refill: Capillary refill takes less than 2 seconds.    Coloration: Skin is not cyanotic or pale.    Findings: No ecchymosis, erythema or rash.    Nails: There is no clubbing. Neurological:    General: No focal deficit present.    Mental Status: He is alert and oriented to person, place, and time. Psychiatric:       Mood and Affect: Mood normal.       Behavior: Behavior normal.  ProceduresAttestation/Critical CareClinical Impressions as of 05/01/23 2319 Palpitations  Attending Supervised: PAI saw and examined the patient. I agree with the findings and plan of care as documented in the PA's note Jerrye Bushy , MDED DispositionDischarge  Jerrye Bushy, MD02/28/25 2319 Barnett Applebaum, PA03/03/25 0948 Jerrye Bushy, MD03/03/25 [phone]

## 2023-05-08 ENCOUNTER — Encounter: Admit: 2023-05-08 | Payer: PRIVATE HEALTH INSURANCE

## 2023-06-15 ENCOUNTER — Ambulatory Visit
Admit: 2023-06-15 | Payer: Medicare (Managed Care) | Attending: Student in an Organized Health Care Education/Training Program

## 2023-06-15 ENCOUNTER — Encounter
Admit: 2023-06-15 | Payer: PRIVATE HEALTH INSURANCE | Attending: Student in an Organized Health Care Education/Training Program

## 2023-06-15 VITALS — BP 122/78 | HR 63 | Ht 66.0 in | Wt 184.0 lb

## 2023-06-15 DIAGNOSIS — E119 Type 2 diabetes mellitus without complications: Secondary | ICD-10-CM

## 2023-06-15 DIAGNOSIS — R079 Chest pain, unspecified: Secondary | ICD-10-CM

## 2023-06-15 DIAGNOSIS — E782 Mixed hyperlipidemia: Secondary | ICD-10-CM

## 2023-06-15 DIAGNOSIS — J45909 Unspecified asthma, uncomplicated: Secondary | ICD-10-CM

## 2023-06-15 DIAGNOSIS — R002 Palpitations: Secondary | ICD-10-CM

## 2023-06-15 MED ORDER — LOSARTAN 25 MG TABLET
25 | Freq: Every day | ORAL | 3.00 refills | 90.00000 days | Status: AC
Start: 2023-06-15 — End: ?

## 2023-06-15 MED ORDER — FAMOTIDINE 40 MG TABLET
40 | ORAL_TABLET | Freq: Every day | ORAL | 8 refills | 90.00000 days | Status: AC
Start: 2023-06-15 — End: 2023-08-04

## 2023-06-26 ENCOUNTER — Inpatient Hospital Stay: Admit: 2023-06-26 | Discharge: 2023-06-26 | Payer: PRIVATE HEALTH INSURANCE

## 2023-06-26 DIAGNOSIS — R079 Chest pain, unspecified: Principal | ICD-10-CM

## 2023-06-29 ENCOUNTER — Telehealth
Admit: 2023-06-29 | Payer: PRIVATE HEALTH INSURANCE | Attending: Student in an Organized Health Care Education/Training Program

## 2023-06-29 NOTE — Other
 Please let Tracy Hancock know that heart ultrasound shows good heart function, mild thickness of the heart muscle and mild enlargement of the aorta which we will watch closely.  His upcoming tests will help evaluate chest pain and palpitations.

## 2023-07-01 ENCOUNTER — Ambulatory Visit: Admit: 2023-07-01 | Payer: PRIVATE HEALTH INSURANCE

## 2023-07-01 ENCOUNTER — Inpatient Hospital Stay: Admit: 2023-07-01 | Discharge: 2023-07-01 | Payer: PRIVATE HEALTH INSURANCE

## 2023-07-01 DIAGNOSIS — R079 Chest pain, unspecified: Secondary | ICD-10-CM

## 2023-07-01 DIAGNOSIS — R002 Palpitations: Secondary | ICD-10-CM

## 2023-07-02 ENCOUNTER — Encounter: Admit: 2023-07-02 | Payer: PRIVATE HEALTH INSURANCE

## 2023-07-02 ENCOUNTER — Inpatient Hospital Stay: Admit: 2023-07-02 | Discharge: 2023-07-02 | Payer: Medicare (Managed Care)

## 2023-07-02 VITALS — BP 138/86 | HR 63 | Ht 66.0 in | Wt 180.0 lb

## 2023-07-02 DIAGNOSIS — R079 Chest pain, unspecified: Principal | ICD-10-CM

## 2023-07-02 DIAGNOSIS — E119 Type 2 diabetes mellitus without complications: Secondary | ICD-10-CM

## 2023-07-02 DIAGNOSIS — R002 Palpitations: Secondary | ICD-10-CM

## 2023-07-02 DIAGNOSIS — J45909 Unspecified asthma, uncomplicated: Secondary | ICD-10-CM

## 2023-07-02 NOTE — Other
 Let patient know Randie Heinz news!  Your stress testing was without signs of heart muscle vulnerability.  No suggestion of heart artery disease.  This was a very reassuring test result. Call me with questions or concerns or message me via MyChart.

## 2023-07-16 MED ORDER — NEBIVOLOL 2.5 MG TABLET
2.5 | ORAL_TABLET | Freq: Every day | ORAL | 12 refills | 90.00000 days | Status: AC
Start: 2023-07-16 — End: 2023-08-19

## 2023-07-16 NOTE — Other
 Please let patient know that his Holter monitor demonstrated frequent abnormal heartbeats from the bottom chamber of the heart that likely explain his palpitations.  This was also seen during his exercise stress test.  We will initiate medicine with nebivolol 2.5 mg daily to help improve symptoms in this regard.  If he has any questions or concerns please call office

## 2023-08-04 MED ORDER — FAMOTIDINE 40 MG TABLET
40 | ORAL_TABLET | Freq: Every day | ORAL | 4 refills | 90.00000 days | Status: AC
Start: 2023-08-04 — End: ?

## 2023-08-19 MED ORDER — NEBIVOLOL 2.5 MG TABLET
2.5 | ORAL_TABLET | Freq: Every day | ORAL | 5 refills | 90.00000 days | Status: AC
Start: 2023-08-19 — End: ?

## 2024-01-11 ENCOUNTER — Encounter
Admit: 2024-01-11 | Payer: PRIVATE HEALTH INSURANCE | Attending: Student in an Organized Health Care Education/Training Program

## 2024-01-25 ENCOUNTER — Encounter
Admit: 2024-01-25 | Payer: PRIVATE HEALTH INSURANCE | Attending: Student in an Organized Health Care Education/Training Program

## 2024-02-01 ENCOUNTER — Encounter
Admit: 2024-02-01 | Payer: PRIVATE HEALTH INSURANCE | Attending: Student in an Organized Health Care Education/Training Program

## 2024-02-01 VITALS — BP 152/88 | HR 60 | Ht 66.0 in | Wt 193.0 lb

## 2024-02-01 DIAGNOSIS — R002 Palpitations: Secondary | ICD-10-CM

## 2024-02-01 DIAGNOSIS — E782 Mixed hyperlipidemia: Secondary | ICD-10-CM

## 2024-02-01 DIAGNOSIS — R079 Chest pain, unspecified: Principal | ICD-10-CM

## 2024-02-01 DIAGNOSIS — I77810 Thoracic aortic ectasia: Secondary | ICD-10-CM

## 2024-02-01 MED ORDER — OLMESARTAN 20 MG TABLET
20 | ORAL_TABLET | Freq: Every day | ORAL | 4 refills | 30.00000 days | Status: AC
Start: 2024-02-01 — End: ?

## 2024-02-01 MED ORDER — NEBIVOLOL 2.5 MG TABLET
2.5 | ORAL_TABLET | Freq: Every day | ORAL | 5 refills | 90.00000 days | Status: AC
Start: 2024-02-01 — End: ?

## 2024-02-01 MED ORDER — FAMOTIDINE 40 MG TABLET
40 | ORAL_TABLET | Freq: Every day | ORAL | 4 refills | 30.00000 days | Status: AC
Start: 2024-02-01 — End: ?

## 2024-02-03 ENCOUNTER — Inpatient Hospital Stay: Admit: 2024-02-03 | Discharge: 2024-02-03 | Payer: Medicare (Managed Care)

## 2024-02-03 DIAGNOSIS — E782 Mixed hyperlipidemia: Principal | ICD-10-CM

## 2024-02-03 LAB — C-REACTIVE PROTEIN     (CRP): BKR C-REACTIVE PROTEIN, HIGH SENSITIVITY: 0.5 mg/L

## 2024-02-03 LAB — COMPREHENSIVE METABOLIC PANEL
BKR A/G RATIO: 1.6 (ref 1.0–2.2)
BKR ALANINE AMINOTRANSFERASE (ALT): 17 U/L (ref 9–59)
BKR ALBUMIN: 4.6 g/dL (ref 3.6–5.1)
BKR ALKALINE PHOSPHATASE: 63 U/L (ref 9–122)
BKR ANION GAP: 10 (ref 7–17)
BKR ASPARTATE AMINOTRANSFERASE (AST): 25 U/L (ref 10–35)
BKR AST/ALT RATIO: 1.5
BKR BILIRUBIN TOTAL: 1.3 mg/dL — ABNORMAL HIGH (ref <=1.2)
BKR BLOOD UREA NITROGEN: 18 mg/dL (ref 8–23)
BKR BUN / CREAT RATIO: 26.5 — ABNORMAL HIGH (ref 8.0–23.0)
BKR CALCIUM: 9.1 mg/dL (ref 8.8–10.2)
BKR CHLORIDE: 101 mmol/L (ref 98–107)
BKR CO2: 25 mmol/L (ref 20–30)
BKR CREATININE DELTA: -0.01
BKR CREATININE: 0.68 mg/dL (ref 0.40–1.30)
BKR EGFR, CREATININE (CKD-EPI 2021): >60 mL/min/1.73m2 (ref >=60)
BKR GLOBULIN: 2.9 g/dL (ref 2.0–3.9)
BKR GLUCOSE: 120 mg/dL — ABNORMAL HIGH (ref 70–100)
BKR POTASSIUM: 4.2 mmol/L (ref 3.3–5.3)
BKR PROTEIN TOTAL: 7.5 g/dL (ref 5.9–8.3)
BKR SODIUM: 136 mmol/L (ref 136–144)

## 2024-02-03 LAB — LIPID PANEL
BKR CHOLESTEROL/HDL RATIO: 3.5 (ref 0.0–5.0)
BKR CHOLESTEROL: 186 mg/dL
BKR HDL CHOLESTEROL: 53 mg/dL (ref >=40)
BKR LDL CHOLESTEROL SAMPSON CALCULATED: 120 mg/dL — ABNORMAL HIGH
BKR TRIGLYCERIDES: 69 mg/dL

## 2024-02-04 LAB — APOLIPOPROTEIN B: APOLIPOPROTEIN B, S: 93 mg/dL

## 2024-02-04 LAB — LIPOPROTEIN A (LPA): LIPOPROTEIN(A), S: 12 nmol/L (ref <75)

## 2024-02-05 ENCOUNTER — Encounter
Admit: 2024-02-05 | Payer: PRIVATE HEALTH INSURANCE | Attending: Student in an Organized Health Care Education/Training Program

## 2024-02-09 ENCOUNTER — Telehealth
Admit: 2024-02-09 | Payer: PRIVATE HEALTH INSURANCE | Attending: Student in an Organized Health Care Education/Training Program

## 2024-08-01 ENCOUNTER — Ambulatory Visit: Admit: 2024-08-01 | Payer: PRIVATE HEALTH INSURANCE
